# Patient Record
Sex: Female | Born: 1965 | Race: White | Hispanic: No | Marital: Married | Smoking: Never smoker
Health system: Southern US, Community
[De-identification: ages and names within clinical notes are randomized; demographics above are authoritative.]

## PROBLEM LIST (undated history)

## (undated) DIAGNOSIS — K759 Inflammatory liver disease, unspecified: Secondary | ICD-10-CM

## (undated) DIAGNOSIS — R200 Anesthesia of skin: Secondary | ICD-10-CM

## (undated) DIAGNOSIS — F329 Major depressive disorder, single episode, unspecified: Secondary | ICD-10-CM

## (undated) DIAGNOSIS — R202 Paresthesia of skin: Secondary | ICD-10-CM

## (undated) DIAGNOSIS — E785 Hyperlipidemia, unspecified: Secondary | ICD-10-CM

## (undated) DIAGNOSIS — M1990 Unspecified osteoarthritis, unspecified site: Secondary | ICD-10-CM

## (undated) DIAGNOSIS — F32A Depression, unspecified: Secondary | ICD-10-CM

## (undated) DIAGNOSIS — K219 Gastro-esophageal reflux disease without esophagitis: Secondary | ICD-10-CM

## (undated) DIAGNOSIS — R350 Frequency of micturition: Secondary | ICD-10-CM

## (undated) HISTORY — DX: Gastro-esophageal reflux disease without esophagitis: K21.9

## (undated) HISTORY — DX: Hyperlipidemia, unspecified: E78.5

## (undated) HISTORY — PX: TUBAL LIGATION: SHX77

## (undated) HISTORY — PX: SHOULDER SURGERY: SHX246

---

## 1983-05-06 DIAGNOSIS — M1990 Unspecified osteoarthritis, unspecified site: Secondary | ICD-10-CM | POA: Insufficient documentation

## 2000-11-27 ENCOUNTER — Other Ambulatory Visit: Admission: RE | Admit: 2000-11-27 | Discharge: 2000-11-27 | Payer: Self-pay | Admitting: Family Medicine

## 2002-03-15 ENCOUNTER — Other Ambulatory Visit: Admission: RE | Admit: 2002-03-15 | Discharge: 2002-03-15 | Payer: Self-pay | Admitting: Family Medicine

## 2006-04-13 ENCOUNTER — Other Ambulatory Visit: Admission: RE | Admit: 2006-04-13 | Discharge: 2006-04-13 | Payer: Self-pay | Admitting: Family Medicine

## 2006-05-01 ENCOUNTER — Encounter: Admission: RE | Admit: 2006-05-01 | Discharge: 2006-05-01 | Payer: Self-pay | Admitting: Family Medicine

## 2006-10-05 ENCOUNTER — Encounter: Admission: RE | Admit: 2006-10-05 | Discharge: 2006-10-05 | Payer: Self-pay | Admitting: Family Medicine

## 2007-04-15 ENCOUNTER — Ambulatory Visit: Payer: Self-pay | Admitting: Cardiovascular Disease

## 2007-05-12 ENCOUNTER — Ambulatory Visit: Payer: Self-pay

## 2008-04-14 ENCOUNTER — Ambulatory Visit: Payer: Self-pay | Admitting: Internal Medicine

## 2008-05-04 ENCOUNTER — Encounter: Payer: Self-pay | Admitting: Internal Medicine

## 2008-05-04 ENCOUNTER — Ambulatory Visit: Payer: Self-pay | Admitting: Internal Medicine

## 2008-05-04 ENCOUNTER — Ambulatory Visit (HOSPITAL_COMMUNITY): Admission: RE | Admit: 2008-05-04 | Discharge: 2008-05-04 | Payer: Self-pay | Admitting: Internal Medicine

## 2008-07-11 ENCOUNTER — Telehealth (INDEPENDENT_AMBULATORY_CARE_PROVIDER_SITE_OTHER): Payer: Self-pay | Admitting: *Deleted

## 2010-09-17 NOTE — H&P (Signed)
NAME:  Gina, Lawrence             ACCOUNT NO.:  [id number]   MEDICAL RECORD NO.:  [id number]          PATIENT TYPE:  AMB   LOCATION:  DAY                           FACILITY:  [facility]   PHYSICIAN:  R. Roetta Sessions, M.D. DATE OF BIRTH:  1965/06/29   DATE OF ADMISSION:  DATE OF DISCHARGE:  LH                              HISTORY & PHYSICAL   REFERRING PHYSICIAN:  [facility].   HISTORY OF PRESENT ILLNESS:  [name] is a very pleasant 45-  year-old Caucasian female from [place], [place] who was referred  by [facility] to further evaluate a 3-week  history of intermittent rectal bleeding.  She is intermittently  constipated, became constipated about 3 weeks ago, and had to strain  quite a bit. She started having some anorectal pain and passing quite a  bit of blood intermittently. She saw the folks over there at [facility]. Stool was found be strongly Hemoccult  positive.  She has been given some antifungal hydrocortisone cream and  suppositories with significant improvement in the pain, but she  continues to pass intermittent blood per rectum.  She has not had any  associated abdominal pain.   PAST MEDICAL HISTORY:  Most significant in that in 2006 she saw Dr.  Elmyra Ricks for bleeding.  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 That apparently was the sole significant finding. She  was brought back for flexible sigmoidoscopy in April of 2007.  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  She had a follow-up exam in January  2008 and was found to have only internal hemorrhoids.  There is no  family history of colonic polyps or GI neoplasia. She has no history  melena, rarely has any upper GI tract symptoms such as odynophagia,  dysphagia, or early sign of reflux symptoms, nausea or vomiting.  She  denies change  in weight.   PAST MEDICAL HISTORY:  Significant for occasional history of PACs.   PAST SURGERIES:  Tubal ligation, right shoulder surgery, cauterization  of the uterine wall, multiple colonoscopies per Dr. Cleotis Nipper.   CURRENT MEDICATIONS:  No prescribed medications.   ALLERGIES:  Question PENICILLIN and a NASAL SPRAY.   FAMILY HISTORY:  Father died with leukemia.  Mother has asthma and  hypertension.  No history chronic GI or liver illness.   SOCIAL HISTORY:  The patient is married. 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 No tobacco. Occasionally consumes an alcoholic beverage maybe  once a month.   REVIEW OF SYSTEMS:  No chest pain and no dyspnea on exertion.  No fever,  chills.  No night sweats. No change in weight.   PHYSICAL EXAMINATION:  GENERAL APPEARANCE:  Pleasant 45 year old lady  resting comfortably.  VITAL SIGNS:  Weight 174, height 5 feet 6, temperature 98.1, BP 120/84,  pulse 72.  SKIN:  Warm and dry.  There is no jaundice.  There is no stigmata of  chronic liver disease.  HEENT: No scleral  icterus.  JVD is not prominent.  CHEST:  Lungs are clear to auscultation.  COR:  Regular rate and rhythm without murmur, gallop, or rub  ABDOMEN:  Nondistended.  Positive bowel sounds.  She has a tattoo on her  right upper quadrant. Her abdomen is entirely soft, nontender without  appreciable mass or organomegaly.  EXTREMITIES: No edema.  RECTAL:  No external lesions.  Digital exam is easily performed. There  is good sphincter tone. The patient really did not have any unusual  tenderness.  No mass in the rectal vault.  No stool in rectal vault.  Mucous is Hemoccult negative.   IMPRESSION:  [name] is very pleasant 45 year old lady  with intermittent rectal bleeding in a setting of constipation. Recently  she had significant anorectal pain 3 weeks ago which has subsided with  topical anti-inflammatory/antifungal agents. History of her erectile  villous  adenoma removed previously.   With her pain and bleeding, it almost sounds like an anal fissure may  have opened up, and now has closed. She is very tolerant of the digital  rectal examination today. However, given her rectal bleeding and  history, personal history of villous adenoma, I told Ms. Dorame that  there is really no way around diagnostic colonoscopy at this time. This  really needs to be done. The risks, benefits, alternatives, and  limitations have been reviewed and questions answered. All parties are  agreeable.  Will plan for diagnostic colonoscopy in the very near future  at [facility] and then will make further recommendations.   I would like to thank [facility] for letting me  see this very nice lady today in  consultation.      Jonathon Bellows, M.D.  Electronically Signed     RMR/MEDQ  D:  04/14/2008  T:  04/14/2008  Job:  [id number]   cc:   [facility]  [place], [place]

## 2010-09-17 NOTE — Assessment & Plan Note (Signed)
[facility]                            CARDIOLOGY OFFICE NOTE   Gina Lawrence, Gina Lawrence                    MRN:          [id number]  DATE:04/15/2007                            DOB:          1965/12/20    Ms. Sweeny is a 45 year old patient referred by Dr. Christell Constant for chest  pain, shortness of breath, and premature ventricular contractions.   The patient has been having symptoms for the last 3 to 4 months.  They  have been increasing in frequency and severity.  They can occur almost  daily.  They can last hours.  She has bad days and good days.  She does  not report chest pain, but heaviness.  She has difficulty taking a deep  breath.  She feels pressure in her chest.  There is no radiation.  There  is no associated diaphoresis.  The patient has not had a cardiac workup.   Her coronary risk factors are minimal.  She is a nonsmoker, non  diabetic, non hypertensive.  Cholesterol status is unknown, and family  history is negative.   The patient is concerned about her heart.  She has a sister who had  these symptoms and had PVCs, and as a result she had a cardiac monitor  that was done, it was benign.  There was an occasional PAC or PVC and  sinus arrhythmia, but no significant sustained arrhythmias.   I read through approximately 15 pages of records from Dr. Kathi Der  office, and also went over the patient's CardioNet monitor.  Time for  this was 10 minutes.   In talking to the patient, she really does not notice her premature  ventricular contractions, she denies rapid palpitations, syncope.   She has had exertional dyspnea.  This occurs at work as well as at home.  She can be short of breath just sitting at her computer.   There is no pleuritic component, no history of DVT or PE.   Her review of systems, otherwise, negative.   PAST MEDICAL HISTORY:  Remarkable for right rotator cuff surgery, tubal  ligation.   The patient is happily married,  she has 2 teenage sons.  One of them has  schizophrenia.  I suspect this is part of the etiology to her symptoms  and stress.  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  She does not smoke or drink.   She is originally from the [place] area.   FAMILY HISTORY:  Remarkable for mother having cardiomyopathy and dying  at the age of 13; however, cardiomyopathy was not premature onset.  Father had leukemia in his 7s or 29s.   She has a sister aged 9 who has PVCs and carries nitro.   CURRENT MEDICATIONS:  Protonix 40 a day which she just started.   She denies any allergies.   PHYSICAL EXAMINATION:  Remarkable for a pleasant, middle-aged white  female in no distress.  Affect is appropriate.  Blood pressure is 120/70, pulse is 80 and regular.  Weight is 164,  respiratory rate 14.  HEENT:  Unremarkable.  Carotids normal without bruit, no lymphadenopathy or thyromegaly, or  JVP  elevation.  LUNGS:  Clear, good diaphragmatic motion, no wheezing.  S1 and S2 with normal heart sounds.  PMI normal.  ABDOMEN:  Benign.  Bowel sounds positive.  No tenderness, no AAA, no  hepatosplenomegaly or hepatojugular reflux.  Distal pulses are intact with no edema.  NEURO:  Nonfocal.  SKIN:  Warm and dry.  No muscular weakness.   Baseline EKG normal.   IMPRESSION:  1. Atypical chest pain, stress Myoview.  Rule out coronary disease.  2. Premature ventricular contractions, rule out structural heart      disease.  Check 2D echocardiogram, assess left ventricular      function, and rule out valvular disease.  No indication for beta      blocker therapy.  3. Probable anxiety and depression.  Follow up with Dr. Christell Constant,      consider starting selective serotonin reuptake inhibitor.  4. History of reflux.  Continue Protonix 40 mg a day, 6-week trial.      Avoid spicy foods.  5. Previous right rotator cuff surgery, seems to have recovered well.      Again, do range of motion exercises at home.   The patient will be seen on  a p.r.n. basis only if her stress test and  echo are abnormal.     Theron Arista C. Eden Emms, MD, [facility]  Electronically Signed    PCN/MedQ  DD: 04/15/2007  DT: 04/15/2007  Job #: [id number]

## 2010-09-17 NOTE — Op Note (Signed)
NAME:  Gina Lawrence, Gina Lawrence             ACCOUNT NO.:  [id number]   MEDICAL RECORD NO.:  [id number]          PATIENT TYPE:  AMB   LOCATION:  DAY                           FACILITY:  [facility]   PHYSICIAN:  R. Roetta Sessions, M.D. DATE OF BIRTH:  12-10-1965   DATE OF PROCEDURE:  DATE OF DISCHARGE:                               OPERATIVE REPORT   PROCEDURE:  Colonoscopy with biopsy and snare polypectomy.   INDICATIONS FOR PROCEDURE:  A 45 year old lady with intermittent rectal  bleeding in the setting of constipation.  She has had some anorectal  pain, which has subsided recently.  She has a history of a villous  adenoma in her rectum removed by Dr. Cleotis Nipper over in [place] previously.  Reportedly, the path illness was a tubulovillous adenoma with no  carcinoma.  Colonoscopy is now being done.  Risks, benefits, and  limitations have been reviewed, questions answered.  She is agreeable.  Please see the documentation in the medical record.   PROCEDURE NOTE:  O2 saturation, blood pressure, pulse, and respiration  monitored throughout the entire procedure.   CONSCIOUS SEDATION:  Versed 4 mg IV and Demerol 100 mg IV in divided  doses.   INSTRUMENT:  Pentax video chip system.   FINDINGS:  Digital rectal exam revealed some couple pea-sized lesions  and palpable anal verge.  Endoscopic findings of the rectum:  Examination of rectal mucosa including retroflexed view of the anal  verge demonstrated couple of anal papilla and a 1-2 cm polypoid mass  very low in the rectum approximately 1 cm from the anal verge that had  about 1 cm.  This lesion was more protuberant and this lesion splayed  out and was more of a carpet-type polypoid lesion and making up the  other 1 cm.  Again, this lesion was very low.  It was well seen on fossa  and retroflexed.  The remainder of rectal mucosa appeared normal.  Colon:  Colonic mucosa was surveyed from the rectosigmoid junction  through the left transverse, right colon  appendiceal orifice, ileocecal  valve, and cecum.  These structures well seen and photographed for the  record.  From this level, the scope was slowly and cautiously withdrawn.  All previously mentioned mucosal surfaces were again seen.  There was  diminutive polyps in the were cecum, which was cold biopsied/removed.  The remainder of colonic mucosa appeared normal.  Attention was turned  to the lesion in the distal rectum.  This lesion was engaged with a  snare on fos and with 2 passes, it was felt to have been removed.  There  was good hemostasis.  The patient tolerated the procedure well and was  reactive to Endoscopy.   IMPRESSION:  1. Anal papilla, very low lying; 1-2 cm adenomatous-appearing lesion      as described above were removed with 2 passes of hot snare cautery,      otherwise rectal mucosa appeared unremarkable.  2. Diminutive cecal polyp, status post cold biopsy, removal of colonic      mucosa appeared normal.   RECOMMENDATIONS:  1. Colace 100 mg orally twice daily  for the next 10 days.  2. I would like her to avoid straining/constipation.  She certainly      will take some MiraLax 17 g orally bedtime p.r.n. for constipation.      We will follow up on path.  3. No Advil or aspirin for the next 5 days.  4. Follow up on path.  5. Further recommendations to follow.      Jonathon Bellows, M.D.  Electronically Signed     RMR/MEDQ  D:  05/04/2008  T:  05/04/2008  Job:  [id number]

## 2011-02-21 ENCOUNTER — Encounter: Payer: Self-pay | Admitting: Internal Medicine

## 2012-07-27 ENCOUNTER — Ambulatory Visit (INDEPENDENT_AMBULATORY_CARE_PROVIDER_SITE_OTHER): Payer: Managed Care, Other (non HMO) | Admitting: Family Medicine

## 2012-07-27 ENCOUNTER — Encounter: Payer: Self-pay | Admitting: Family Medicine

## 2012-07-27 ENCOUNTER — Telehealth: Payer: Self-pay | Admitting: Nurse Practitioner

## 2012-07-27 ENCOUNTER — Ambulatory Visit (INDEPENDENT_AMBULATORY_CARE_PROVIDER_SITE_OTHER): Payer: Managed Care, Other (non HMO)

## 2012-07-27 VITALS — BP 119/80 | HR 69 | Temp 98.7°F | Ht 64.75 in | Wt 180.0 lb

## 2012-07-27 DIAGNOSIS — M542 Cervicalgia: Secondary | ICD-10-CM

## 2012-07-27 MED ORDER — MELOXICAM 15 MG PO TABS: ORAL_TABLET | ORAL | Status: DC

## 2012-07-27 MED ORDER — CYCLOBENZAPRINE HCL 10 MG PO TABS: ORAL_TABLET | ORAL | Status: DC

## 2012-07-27 NOTE — Telephone Encounter (Signed)
Patient would like an appt today for unexplained neck pain at base of neck at the back of her head along with a headache.

## 2012-07-27 NOTE — Patient Instructions (Addendum)
Moist heat  No lifting Zantac 150 twice daily before meals Be sure and take Mobic after meals

## 2012-07-27 NOTE — Progress Notes (Signed)
  Subjective:    Patient ID: Gina Lawrence, female    DOB: [birth year], 47 y.o.   MRN: [id number]  HPI Neck pain for 8 days.No specific injury. No past injury. History of cervical arthritis. Taking over-the-counter NSAIDs with no relief. Pain worse with looking right or left. The pain seems to radiate down between the shoulder blades. No fever noted. Headaches off and on frontal in nature but not today   Review of Systems  Musculoskeletal: Positive for back pain (neck, started 1 wk ago).  Neurological: Positive for dizziness (occasional) and headaches.       Objective:   Physical Exam  Nursing note and vitals reviewed. Constitutional: She is oriented to person, place, and time. She appears well-developed and well-nourished.  HENT:  Head: Normocephalic.  Eyes: Conjunctivae and EOM are normal.  Neck: Neck supple. No tracheal deviation present. No thyromegaly present.  Limited  rom due to increased pain  Musculoskeletal: Normal range of motion.  Except neck  Lymphadenopathy:    She has no cervical adenopathy.  Neurological: She is alert and oriented to person, place, and time. She has normal reflexes. She displays normal reflexes. Coordination normal.  Psychiatric: She has a normal mood and affect. Her behavior is normal. Judgment and thought content normal.   [facility] reading (PRIMARY) by  Dr. Rudi Heap: There appears to be a lot of abnormal straightening of the cervical spine indicating muscle spasm. Some spurring present. There appears to be some narrowing of the foramen.                                       Assessment & Plan:   1. Posterior neck pain  - DG Cervical Spine Complete; Future - meloxicam (MOBIC) 15 MG tablet; 1/2 tab twice daily pc  Dispense: 30 tablet; Refill: 0 - cyclobenzaprine (FLEXERIL) 10 MG tablet; Twice daily after meals  Dispense: 30 tablet; Refill: 0  2. Protect stomach from NSAID 3 follow directions and patient instruction

## 2012-07-27 NOTE — Telephone Encounter (Signed)
APPT MADE FOR TODAY

## 2013-10-19 ENCOUNTER — Ambulatory Visit: Payer: Managed Care, Other (non HMO) | Admitting: Physician Assistant

## 2014-01-03 ENCOUNTER — Ambulatory Visit: Payer: Managed Care, Other (non HMO) | Attending: Neurosurgery | Admitting: Physical Therapy

## 2014-01-03 DIAGNOSIS — IMO0001 Reserved for inherently not codable concepts without codable children: Secondary | ICD-10-CM | POA: Diagnosis not present

## 2014-01-03 DIAGNOSIS — M47812 Spondylosis without myelopathy or radiculopathy, cervical region: Secondary | ICD-10-CM | POA: Diagnosis not present

## 2014-01-03 DIAGNOSIS — R5381 Other malaise: Secondary | ICD-10-CM | POA: Diagnosis not present

## 2014-01-05 ENCOUNTER — Ambulatory Visit: Payer: Managed Care, Other (non HMO) | Admitting: Physical Therapy

## 2014-01-05 DIAGNOSIS — IMO0001 Reserved for inherently not codable concepts without codable children: Secondary | ICD-10-CM | POA: Diagnosis not present

## 2014-01-10 ENCOUNTER — Ambulatory Visit: Payer: Managed Care, Other (non HMO) | Admitting: *Deleted

## 2014-01-10 DIAGNOSIS — IMO0001 Reserved for inherently not codable concepts without codable children: Secondary | ICD-10-CM | POA: Diagnosis not present

## 2014-01-13 ENCOUNTER — Ambulatory Visit: Payer: Managed Care, Other (non HMO) | Admitting: *Deleted

## 2014-01-13 DIAGNOSIS — IMO0001 Reserved for inherently not codable concepts without codable children: Secondary | ICD-10-CM | POA: Diagnosis not present

## 2014-01-16 ENCOUNTER — Other Ambulatory Visit (HOSPITAL_COMMUNITY): Payer: Self-pay | Admitting: Nurse Practitioner

## 2014-01-16 DIAGNOSIS — Z139 Encounter for screening, unspecified: Secondary | ICD-10-CM

## 2014-01-17 ENCOUNTER — Ambulatory Visit: Payer: Managed Care, Other (non HMO) | Admitting: *Deleted

## 2014-01-17 DIAGNOSIS — IMO0001 Reserved for inherently not codable concepts without codable children: Secondary | ICD-10-CM | POA: Diagnosis not present

## 2014-01-19 ENCOUNTER — Ambulatory Visit (HOSPITAL_COMMUNITY): Payer: Managed Care, Other (non HMO)

## 2014-01-24 ENCOUNTER — Ambulatory Visit: Payer: Managed Care, Other (non HMO) | Admitting: *Deleted

## 2014-01-24 DIAGNOSIS — IMO0001 Reserved for inherently not codable concepts without codable children: Secondary | ICD-10-CM | POA: Diagnosis not present

## 2014-01-25 ENCOUNTER — Ambulatory Visit (HOSPITAL_COMMUNITY)
Admission: RE | Admit: 2014-01-25 | Discharge: 2014-01-25 | Disposition: A | Discharge status: Home / Self Care | Payer: Managed Care, Other (non HMO) | Source: Ambulatory Visit | Attending: Nurse Practitioner | Admitting: Nurse Practitioner

## 2014-01-25 DIAGNOSIS — Z1231 Encounter for screening mammogram for malignant neoplasm of breast: Secondary | ICD-10-CM | POA: Diagnosis present

## 2014-01-25 DIAGNOSIS — Z139 Encounter for screening, unspecified: Secondary | ICD-10-CM

## 2014-01-26 ENCOUNTER — Encounter: Payer: Managed Care, Other (non HMO) | Admitting: *Deleted

## 2014-02-01 ENCOUNTER — Other Ambulatory Visit (HOSPITAL_COMMUNITY): Payer: Self-pay | Admitting: Neurosurgery

## 2014-02-06 ENCOUNTER — Encounter (HOSPITAL_COMMUNITY): Payer: Self-pay | Admitting: Pharmacy Technician

## 2014-02-08 NOTE — Pre-Procedure Instructions (Signed)
Gina Lawrence  02/08/2014   Your procedure is scheduled on:  Tuesday February 14, 2014 at 11:30 AM.  Report to [facility] Admitting at 9:30 AM.  Call this number if you have problems the morning of surgery: [phone]   Remember:   Do not eat food or drink liquids after midnight.   Take these medicines the morning of surgery with A SIP OF WATER: Buspirone (Buspar), Omeprazole (Prilosec), Prempro   Do not wear jewelry, make-up or nail polish.  Do not wear lotions, powders, or perfumes.   Do not shave 48 hours prior to surgery.   Do not bring valuables to the hospital.  [facility] is not responsible for any belongings or valuables.               Contacts, dentures or bridgework may not be worn into surgery.  Leave suitcase in the car. After surgery it may be brought to your room.  For patients admitted to the hospital, discharge time is determined by your treatment team.               Patients discharged the day of surgery will not be allowed to drive home.  Name and phone number of your driver: Family/Friend  Special Instructions: Shower using CHG soap the night before and the morning of your surgery   Please read over the following fact sheets that you were given: Pain Booklet, Coughing and Deep Breathing, MRSA Information and Surgical Site Infection Prevention

## 2014-02-09 ENCOUNTER — Encounter (HOSPITAL_COMMUNITY)
Admission: RE | Admit: 2014-02-09 | Discharge: 2014-02-09 | Disposition: A | Discharge status: Home / Self Care | Payer: Managed Care, Other (non HMO) | Source: Ambulatory Visit | Attending: Neurosurgery | Admitting: Neurosurgery

## 2014-02-09 ENCOUNTER — Encounter (HOSPITAL_COMMUNITY): Payer: Self-pay

## 2014-02-09 DIAGNOSIS — K219 Gastro-esophageal reflux disease without esophagitis: Secondary | ICD-10-CM | POA: Diagnosis not present

## 2014-02-09 DIAGNOSIS — Z0000 Encounter for general adult medical examination without abnormal findings: Secondary | ICD-10-CM | POA: Insufficient documentation

## 2014-02-09 DIAGNOSIS — M4722 Other spondylosis with radiculopathy, cervical region: Secondary | ICD-10-CM | POA: Insufficient documentation

## 2014-02-09 DIAGNOSIS — R350 Frequency of micturition: Secondary | ICD-10-CM | POA: Insufficient documentation

## 2014-02-09 DIAGNOSIS — M1990 Unspecified osteoarthritis, unspecified site: Secondary | ICD-10-CM | POA: Insufficient documentation

## 2014-02-09 DIAGNOSIS — F329 Major depressive disorder, single episode, unspecified: Secondary | ICD-10-CM | POA: Insufficient documentation

## 2014-02-09 DIAGNOSIS — Z8619 Personal history of other infectious and parasitic diseases: Secondary | ICD-10-CM | POA: Insufficient documentation

## 2014-02-09 DIAGNOSIS — R200 Anesthesia of skin: Secondary | ICD-10-CM | POA: Insufficient documentation

## 2014-02-09 HISTORY — DX: Frequency of micturition: R35.0

## 2014-02-09 HISTORY — DX: Anesthesia of skin: R20.0

## 2014-02-09 HISTORY — DX: Paresthesia of skin: R20.2

## 2014-02-09 HISTORY — DX: Unspecified osteoarthritis, unspecified site: M19.90

## 2014-02-09 HISTORY — DX: Inflammatory liver disease, unspecified: K75.9

## 2014-02-09 HISTORY — DX: Major depressive disorder, single episode, unspecified: F32.9

## 2014-02-09 HISTORY — DX: Depression, unspecified: F32.A

## 2014-02-09 LAB — COMPREHENSIVE METABOLIC PANEL
ALBUMIN: 3.5 g/dL (ref 3.5–5.2)
ALK PHOS: 92 U/L (ref 39–117)
ALT: 10 U/L (ref 0–35)
ANION GAP: 12 (ref 5–15)
AST: 15 U/L (ref 0–37)
BUN: 9 mg/dL (ref 6–23)
CHLORIDE: 107 meq/L (ref 96–112)
CO2: 25 meq/L (ref 19–32)
Calcium: 8.9 mg/dL (ref 8.4–10.5)
Creatinine, Ser: 0.75 mg/dL (ref 0.50–1.10)
GFR calc Af Amer: >90 mL/min (ref >90)
GFR calc non Af Amer: >90 mL/min (ref >90)
Glucose, Bld: 102 mg/dL — ABNORMAL HIGH (ref 70–99)
Potassium: 3.8 mEq/L (ref 3.7–5.3)
Sodium: 144 mEq/L (ref 137–147)
Total Protein: 6.9 g/dL (ref 6.0–8.3)

## 2014-02-09 LAB — CBC
HCT: 40 % (ref 36.0–46.0)
Hemoglobin: 13.8 g/dL (ref 12.0–15.0)
MCH: 29.8 pg (ref 26.0–34.0)
MCHC: 34.5 g/dL (ref 30.0–36.0)
MCV: 86.4 fL (ref 78.0–100.0)
PLATELETS: 259 10*3/uL (ref 150–400)
RBC: 4.63 MIL/uL (ref 3.87–5.11)
RDW: 12.8 % (ref 11.5–15.5)
WBC: 6.2 10*3/uL (ref 4.0–10.5)

## 2014-02-09 LAB — SURGICAL PCR SCREEN
MRSA, PCR: NEGATIVE
Staphylococcus aureus: NEGATIVE

## 2014-02-09 LAB — HCG, SERUM, QUALITATIVE: PREG SERUM: NEGATIVE

## 2014-02-09 NOTE — Progress Notes (Signed)
PCP is at [facility]. Patient informed Nurse that she had a stress test approximately 5 years ago but stated it was related to menopause symptoms. Patient stated "I could feel something in my chest. It was like it skipped a beat. I went to the doctor and they said I was having some PAC's or PVC's something like that, but I looked it up online and it was related to menopause." Patient denied having any acute cardiac or pulmonary issues. Patient informed Nurse that as a child she had hepatitis and was told not to ever donate blood.

## 2014-02-14 ENCOUNTER — Observation Stay (HOSPITAL_COMMUNITY)
Admission: RE | Admit: 2014-02-14 | Discharge: 2014-02-15 | Disposition: A | Discharge status: Home / Self Care | Payer: Managed Care, Other (non HMO) | Source: Ambulatory Visit | Attending: Neurosurgery | Admitting: Neurosurgery

## 2014-02-14 ENCOUNTER — Encounter (HOSPITAL_COMMUNITY): Payer: Managed Care, Other (non HMO) | Admitting: Anesthesiology

## 2014-02-14 ENCOUNTER — Encounter (HOSPITAL_COMMUNITY)
Admission: RE | Disposition: A | Discharge status: Home / Self Care | Payer: Self-pay | Source: Ambulatory Visit | Attending: Neurosurgery

## 2014-02-14 ENCOUNTER — Encounter (HOSPITAL_COMMUNITY): Payer: Self-pay | Admitting: Surgery

## 2014-02-14 ENCOUNTER — Inpatient Hospital Stay (HOSPITAL_COMMUNITY): Payer: Managed Care, Other (non HMO) | Admitting: Anesthesiology

## 2014-02-14 ENCOUNTER — Inpatient Hospital Stay (HOSPITAL_COMMUNITY): Payer: Managed Care, Other (non HMO)

## 2014-02-14 DIAGNOSIS — M479 Spondylosis, unspecified: Secondary | ICD-10-CM

## 2014-02-14 DIAGNOSIS — F329 Major depressive disorder, single episode, unspecified: Secondary | ICD-10-CM | POA: Insufficient documentation

## 2014-02-14 DIAGNOSIS — M4722 Other spondylosis with radiculopathy, cervical region: Principal | ICD-10-CM | POA: Insufficient documentation

## 2014-02-14 DIAGNOSIS — K219 Gastro-esophageal reflux disease without esophagitis: Secondary | ICD-10-CM | POA: Insufficient documentation

## 2014-02-14 HISTORY — PX: ANTERIOR CERVICAL DECOMP/DISCECTOMY FUSION: SHX1161

## 2014-02-14 LAB — CREATININE, SERUM
Creatinine, Ser: 0.66 mg/dL (ref 0.50–1.10)
GFR calc Af Amer: >90 mL/min (ref >90)

## 2014-02-14 LAB — CBC
HCT: 41.7 % (ref 36.0–46.0)
HEMOGLOBIN: 13.8 g/dL (ref 12.0–15.0)
MCH: 28.9 pg (ref 26.0–34.0)
MCHC: 33.1 g/dL (ref 30.0–36.0)
MCV: 87.4 fL (ref 78.0–100.0)
Platelets: 308 10*3/uL (ref 150–400)
RBC: 4.77 MIL/uL (ref 3.87–5.11)
RDW: 12.7 % (ref 11.5–15.5)
WBC: 10.9 10*3/uL — ABNORMAL HIGH (ref 4.0–10.5)

## 2014-02-14 SURGERY — ANTERIOR CERVICAL DECOMPRESSION/DISCECTOMY FUSION 3 LEVELS
Anesthesia: General | Site: Neck

## 2014-02-14 MED ORDER — ACETAMINOPHEN 325 MG PO TABS: 650.0000 mg | ORAL_TABLET | ORAL | Status: DC | PRN

## 2014-02-14 MED ORDER — VECURONIUM BROMIDE 10 MG IV SOLR
INTRAVENOUS | Status: AC
  Filled 2014-02-14: qty 10

## 2014-02-14 MED ORDER — DEXMEDETOMIDINE HCL IN NACL 200 MCG/50ML IV SOLN
INTRAVENOUS | Status: AC
  Filled 2014-02-14: qty 50

## 2014-02-14 MED ORDER — VITAMIN D3 25 MCG (1000 UNIT) PO TABS
2000.0000 [IU] | ORAL_TABLET | Freq: Every day | ORAL | Status: DC
  Filled 2014-02-14 (×2): qty 2

## 2014-02-14 MED ORDER — SODIUM CHLORIDE 0.9 % IR SOLN
Status: DC | PRN
  Administered 2014-02-14: 13:00:00

## 2014-02-14 MED ORDER — LIDOCAINE-EPINEPHRINE 1 %-1:100000 IJ SOLN
INTRAMUSCULAR | Status: DC | PRN
  Administered 2014-02-14: 9 mL

## 2014-02-14 MED ORDER — LIDOCAINE HCL (CARDIAC) 20 MG/ML IV SOLN
INTRAVENOUS | Status: AC
  Filled 2014-02-14: qty 10

## 2014-02-14 MED ORDER — FENTANYL CITRATE 0.05 MG/ML IJ SOLN
INTRAMUSCULAR | Status: AC
  Filled 2014-02-14: qty 5

## 2014-02-14 MED ORDER — ONDANSETRON HCL 4 MG/2ML IJ SOLN
INTRAMUSCULAR | Status: DC | PRN
  Administered 2014-02-14: 4 mg via INTRAVENOUS

## 2014-02-14 MED ORDER — ROCURONIUM BROMIDE 100 MG/10ML IV SOLN
INTRAVENOUS | Status: DC | PRN
  Administered 2014-02-14: 50 mg via INTRAVENOUS

## 2014-02-14 MED ORDER — PHENOL 1.4 % MT LIQD: 1.0000 | OROMUCOSAL | Status: DC | PRN

## 2014-02-14 MED ORDER — PANTOPRAZOLE SODIUM 40 MG PO TBEC
40.0000 mg | DELAYED_RELEASE_TABLET | Freq: Every day | ORAL | Status: DC
  Administered 2014-02-14: 40 mg via ORAL
  Filled 2014-02-14: qty 1

## 2014-02-14 MED ORDER — SODIUM CHLORIDE 0.9 % IV SOLN
INTRAVENOUS | Status: DC
  Administered 2014-02-14 (×2): via INTRAVENOUS

## 2014-02-14 MED ORDER — LACTATED RINGERS IV SOLN
INTRAVENOUS | Status: DC
  Administered 2014-02-14 (×2): via INTRAVENOUS

## 2014-02-14 MED ORDER — CONJ ESTROG-MEDROXYPROGEST ACE 0.625-2.5 MG PO TABS
1.0000 | ORAL_TABLET | Freq: Every day | ORAL | Status: DC
  Filled 2014-02-14 (×2): qty 1

## 2014-02-14 MED ORDER — MENTHOL 3 MG MT LOZG
1.0000 | LOZENGE | OROMUCOSAL | Status: DC | PRN
Start: 2014-02-14 — End: 2014-02-15
  Administered 2014-02-14: 3 mg via ORAL
  Filled 2014-02-14: qty 9

## 2014-02-14 MED ORDER — HYDROMORPHONE HCL 1 MG/ML IJ SOLN
0.5000 mg | INTRAMUSCULAR | Status: DC | PRN
  Administered 2014-02-14 (×2): 1 mg via INTRAVENOUS
  Filled 2014-02-14 (×2): qty 1

## 2014-02-14 MED ORDER — HEPARIN SODIUM (PORCINE) 5000 UNIT/ML IJ SOLN
5000.0000 [IU] | Freq: Three times a day (TID) | INTRAMUSCULAR | Status: DC
  Administered 2014-02-15: 5000 [IU] via SUBCUTANEOUS
  Filled 2014-02-14 (×4): qty 1

## 2014-02-14 MED ORDER — ONDANSETRON HCL 4 MG/2ML IJ SOLN
4.0000 mg | INTRAMUSCULAR | Status: DC | PRN
Start: 2014-02-14 — End: 2014-02-15

## 2014-02-14 MED ORDER — VECURONIUM BROMIDE 10 MG IV SOLR
INTRAVENOUS | Status: DC | PRN
  Administered 2014-02-14: 1 mg via INTRAVENOUS
  Administered 2014-02-14 (×2): 2 mg via INTRAVENOUS

## 2014-02-14 MED ORDER — VITAMIN D 50 MCG (2000 UT) PO CAPS: 1.0000 | ORAL_CAPSULE | Freq: Every day | ORAL | Status: DC

## 2014-02-14 MED ORDER — GLYCOPYRROLATE 0.2 MG/ML IJ SOLN
INTRAMUSCULAR | Status: DC | PRN
  Administered 2014-02-14: 0.6 mg via INTRAVENOUS

## 2014-02-14 MED ORDER — THROMBIN 20000 UNITS EX SOLR
CUTANEOUS | Status: DC | PRN
  Administered 2014-02-14: 13:00:00 via TOPICAL

## 2014-02-14 MED ORDER — SENNA 8.6 MG PO TABS
1.0000 | ORAL_TABLET | Freq: Two times a day (BID) | ORAL | Status: DC
  Administered 2014-02-14: 8.6 mg via ORAL
  Filled 2014-02-14 (×3): qty 1

## 2014-02-14 MED ORDER — PROPOFOL 10 MG/ML IV BOLUS
INTRAVENOUS | Status: AC
  Filled 2014-02-14: qty 20

## 2014-02-14 MED ORDER — SODIUM CHLORIDE 0.9 % IJ SOLN
INTRAMUSCULAR | Status: AC
  Filled 2014-02-14: qty 10

## 2014-02-14 MED ORDER — HYDROMORPHONE HCL 1 MG/ML IJ SOLN
0.2500 mg | INTRAMUSCULAR | Status: DC | PRN
  Administered 2014-02-14: 0.5 mg via INTRAVENOUS

## 2014-02-14 MED ORDER — DEXAMETHASONE SODIUM PHOSPHATE 10 MG/ML IJ SOLN
INTRAMUSCULAR | Status: AC
  Filled 2014-02-14: qty 1

## 2014-02-14 MED ORDER — THROMBIN 5000 UNITS EX SOLR
OROMUCOSAL | Status: DC | PRN
  Administered 2014-02-14: 13:00:00 via TOPICAL

## 2014-02-14 MED ORDER — ACETAMINOPHEN 650 MG RE SUPP: 650.0000 mg | RECTAL | Status: DC | PRN

## 2014-02-14 MED ORDER — SODIUM CHLORIDE 0.9 % IJ SOLN
3.0000 mL | Freq: Two times a day (BID) | INTRAMUSCULAR | Status: DC
  Administered 2014-02-14: 3 mL via INTRAVENOUS

## 2014-02-14 MED ORDER — FENTANYL CITRATE 0.05 MG/ML IJ SOLN
INTRAMUSCULAR | Status: DC | PRN
  Administered 2014-02-14: 100 ug via INTRAVENOUS
  Administered 2014-02-14: 150 ug via INTRAVENOUS
  Administered 2014-02-14: 100 ug via INTRAVENOUS

## 2014-02-14 MED ORDER — BUSPIRONE HCL 15 MG PO TABS
7.5000 mg | ORAL_TABLET | Freq: Three times a day (TID) | ORAL | Status: DC | PRN
  Filled 2014-02-14: qty 1

## 2014-02-14 MED ORDER — DOCUSATE SODIUM 100 MG PO CAPS
100.0000 mg | ORAL_CAPSULE | Freq: Two times a day (BID) | ORAL | Status: DC
  Administered 2014-02-14: 100 mg via ORAL
  Filled 2014-02-14 (×3): qty 1

## 2014-02-14 MED ORDER — CALCIUM CARBONATE 1250 (500 CA) MG PO TABS
1.0000 | ORAL_TABLET | Freq: Every day | ORAL | Status: DC
  Filled 2014-02-14 (×2): qty 1

## 2014-02-14 MED ORDER — LIDOCAINE HCL (CARDIAC) 20 MG/ML IV SOLN
INTRAVENOUS | Status: DC | PRN
  Administered 2014-02-14: 100 mg via INTRAVENOUS

## 2014-02-14 MED ORDER — MIDAZOLAM HCL 2 MG/2ML IJ SOLN
INTRAMUSCULAR | Status: AC
  Filled 2014-02-14: qty 2

## 2014-02-14 MED ORDER — DEXAMETHASONE SODIUM PHOSPHATE 10 MG/ML IJ SOLN
INTRAMUSCULAR | Status: DC | PRN
  Administered 2014-02-14: 10 mg via INTRAVENOUS

## 2014-02-14 MED ORDER — HYDROMORPHONE HCL 1 MG/ML IJ SOLN
INTRAMUSCULAR | Status: AC
Start: 2014-02-14 — End: 2014-02-15
  Filled 2014-02-14: qty 1

## 2014-02-14 MED ORDER — MIDAZOLAM HCL 5 MG/5ML IJ SOLN
INTRAMUSCULAR | Status: DC | PRN
  Administered 2014-02-14: 2 mg via INTRAVENOUS

## 2014-02-14 MED ORDER — 0.9 % SODIUM CHLORIDE (POUR BTL) OPTIME
TOPICAL | Status: DC | PRN
  Administered 2014-02-14: 1000 mL

## 2014-02-14 MED ORDER — DEXMEDETOMIDINE HCL 200 MCG/2ML IV SOLN
INTRAVENOUS | Status: DC | PRN
  Administered 2014-02-14: 8 ug via INTRAVENOUS
  Administered 2014-02-14: 12 ug via INTRAVENOUS
  Administered 2014-02-14: 8 ug via INTRAVENOUS

## 2014-02-14 MED ORDER — DIAZEPAM 5 MG PO TABS
5.0000 mg | ORAL_TABLET | Freq: Four times a day (QID) | ORAL | Status: DC | PRN
  Administered 2014-02-15: 5 mg via ORAL
  Filled 2014-02-14: qty 1

## 2014-02-14 MED ORDER — VANCOMYCIN HCL IN DEXTROSE 1-5 GM/200ML-% IV SOLN
1000.0000 mg | Freq: Once | INTRAVENOUS | Status: AC
  Administered 2014-02-14: 1000 mg via INTRAVENOUS
  Filled 2014-02-14: qty 200

## 2014-02-14 MED ORDER — VANCOMYCIN HCL IN DEXTROSE 1-5 GM/200ML-% IV SOLN
INTRAVENOUS | Status: AC
  Administered 2014-02-14: 1000 mg via INTRAVENOUS
  Filled 2014-02-14: qty 200

## 2014-02-14 MED ORDER — STERILE WATER FOR INJECTION IJ SOLN
INTRAMUSCULAR | Status: AC
  Filled 2014-02-14: qty 10

## 2014-02-14 MED ORDER — SODIUM CHLORIDE 0.9 % IJ SOLN
3.0000 mL | INTRAMUSCULAR | Status: DC | PRN
  Administered 2014-02-14: 3 mL via INTRAVENOUS

## 2014-02-14 MED ORDER — NEOSTIGMINE METHYLSULFATE 10 MG/10ML IV SOLN
INTRAVENOUS | Status: DC | PRN
  Administered 2014-02-14: 4 mg via INTRAVENOUS

## 2014-02-14 MED ORDER — OXYCODONE-ACETAMINOPHEN 5-325 MG PO TABS
1.0000 | ORAL_TABLET | ORAL | Status: DC | PRN
  Administered 2014-02-14 – 2014-02-15 (×2): 2 via ORAL
  Filled 2014-02-14 (×2): qty 2

## 2014-02-14 MED ORDER — PROPOFOL 10 MG/ML IV BOLUS
INTRAVENOUS | Status: DC | PRN
  Administered 2014-02-14: 150 mg via INTRAVENOUS

## 2014-02-14 SURGICAL SUPPLY — 72 items
ADH SKN CLS APL DERMABOND .7 (GAUZE/BANDAGES/DRESSINGS) ×1
ADH SKN CLS LQ APL DERMABOND (GAUZE/BANDAGES/DRESSINGS) ×2
APL SKNCLS STERI-STRIP NONHPOA (GAUZE/BANDAGES/DRESSINGS)
BAG DECANTER FOR FLEXI CONT (MISCELLANEOUS) ×3 IMPLANT
BENZOIN TINCTURE PRP APPL 2/3 (GAUZE/BANDAGES/DRESSINGS) IMPLANT
BLADE CLIPPER SURG (BLADE) IMPLANT
BLADE SURG 11 STRL SS (BLADE) ×3 IMPLANT
BUR DRUM 4.0 (BURR) IMPLANT
BUR DRUM 4.0MM (BURR)
BUR MATCHSTICK NEURO 3.0 LAGG (BURR) ×3 IMPLANT
CAGE PEEK VISTA-S CERV 11X14X5 (Cage) ×4 IMPLANT
CANISTER SUCT 3000ML (MISCELLANEOUS) ×3 IMPLANT
CLOSURE WOUND 1/2 X4 (GAUZE/BANDAGES/DRESSINGS)
CONT SPEC 4OZ CLIKSEAL STRL BL (MISCELLANEOUS) ×3 IMPLANT
DECANTER SPIKE VIAL GLASS SM (MISCELLANEOUS) ×3 IMPLANT
DERMABOND ADHESIVE PROPEN (GAUZE/BANDAGES/DRESSINGS) ×4
DERMABOND ADVANCED (GAUZE/BANDAGES/DRESSINGS) ×2
DERMABOND ADVANCED .7 DNX12 (GAUZE/BANDAGES/DRESSINGS) ×1 IMPLANT
DERMABOND ADVANCED .7 DNX6 (GAUZE/BANDAGES/DRESSINGS) IMPLANT
DRAIN CHANNEL 10M FLAT 3/4 FLT (DRAIN) IMPLANT
DRAPE C-ARM 42X72 X-RAY (DRAPES) ×6 IMPLANT
DRAPE LAPAROTOMY 100X72 PEDS (DRAPES) ×3 IMPLANT
DRAPE MICROSCOPE LEICA (MISCELLANEOUS) ×3 IMPLANT
DRAPE POUCH INSTRU U-SHP 10X18 (DRAPES) ×3 IMPLANT
DRAPE PROXIMA HALF (DRAPES) IMPLANT
DRSG OPSITE POSTOP 3X4 (GAUZE/BANDAGES/DRESSINGS) ×3 IMPLANT
DRSG OPSITE POSTOP 4X6 (GAUZE/BANDAGES/DRESSINGS) ×2 IMPLANT
DRSG TEGADERM 4X4.75 (GAUZE/BANDAGES/DRESSINGS) ×12 IMPLANT
DURAPREP 6ML APPLICATOR 50/CS (WOUND CARE) ×3 IMPLANT
ELECT COATED BLADE 2.86 ST (ELECTRODE) ×3 IMPLANT
ELECT REM PT RETURN 9FT ADLT (ELECTROSURGICAL) ×3
ELECTRODE REM PT RTRN 9FT ADLT (ELECTROSURGICAL) ×1 IMPLANT
EVACUATOR SILICONE 100CC (DRAIN) IMPLANT
GAUZE SPONGE 4X4 16PLY XRAY LF (GAUZE/BANDAGES/DRESSINGS) IMPLANT
GLOVE BIOGEL PI IND STRL 7.5 (GLOVE) ×1 IMPLANT
GLOVE BIOGEL PI INDICATOR 7.5 (GLOVE) ×2
GLOVE ECLIPSE 7.0 STRL STRAW (GLOVE) ×3 IMPLANT
GLOVE EXAM NITRILE LRG STRL (GLOVE) IMPLANT
GLOVE EXAM NITRILE MD LF STRL (GLOVE) IMPLANT
GLOVE EXAM NITRILE XL STR (GLOVE) IMPLANT
GLOVE EXAM NITRILE XS STR PU (GLOVE) IMPLANT
GOWN STRL REUS W/ TWL LRG LVL3 (GOWN DISPOSABLE) ×2 IMPLANT
GOWN STRL REUS W/ TWL XL LVL3 (GOWN DISPOSABLE) IMPLANT
GOWN STRL REUS W/TWL 2XL LVL3 (GOWN DISPOSABLE) IMPLANT
GOWN STRL REUS W/TWL LRG LVL3 (GOWN DISPOSABLE) ×6
GOWN STRL REUS W/TWL XL LVL3 (GOWN DISPOSABLE)
HEMOSTAT POWDER KIT SURGIFOAM (HEMOSTASIS) ×3 IMPLANT
KIT BASIN OR (CUSTOM PROCEDURE TRAY) ×3 IMPLANT
KIT ROOM TURNOVER OR (KITS) ×3 IMPLANT
NDL HYPO 25X1 1.5 SAFETY (NEEDLE) ×1 IMPLANT
NDL SPNL 22GX3.5 QUINCKE BK (NEEDLE) ×1 IMPLANT
NEEDLE HYPO 25X1 1.5 SAFETY (NEEDLE) ×3 IMPLANT
NEEDLE SPNL 22GX3.5 QUINCKE BK (NEEDLE) ×3 IMPLANT
NS IRRIG 1000ML POUR BTL (IV SOLUTION) ×3 IMPLANT
PACK LAMINECTOMY NEURO (CUSTOM PROCEDURE TRAY) ×3 IMPLANT
PAD ARMBOARD 7.5X6 YLW CONV (MISCELLANEOUS) ×9 IMPLANT
PEEK OPTIMA VISTA-S 11X14X5MM (Peek) ×2 IMPLANT
PLATE INVIZIA 3 LEV 54MM (Plate) ×2 IMPLANT
PUTTY BONE GRAFT KIT 2.5ML (Bone Implant) ×2 IMPLANT
RUBBERBAND STERILE (MISCELLANEOUS) ×6 IMPLANT
SCREW SELF DRILL VAR 12MM (Screw) ×16 IMPLANT
SPONGE INTESTINAL PEANUT (DISPOSABLE) ×3 IMPLANT
SPONGE SURGIFOAM ABS GEL 100 (HEMOSTASIS) ×3 IMPLANT
STRIP CLOSURE SKIN 1/2X4 (GAUZE/BANDAGES/DRESSINGS) IMPLANT
SUT ETHILON 3 0 FSL (SUTURE) IMPLANT
SUT VIC AB 3-0 SH 8-18 (SUTURE) ×3 IMPLANT
SUT VICRYL 3-0 RB1 18 ABS (SUTURE) ×3 IMPLANT
SYR 20ML ECCENTRIC (SYRINGE) ×3 IMPLANT
TAPE CLOTH 2X10 TAN LF (GAUZE/BANDAGES/DRESSINGS) ×3 IMPLANT
TOWEL OR 17X24 6PK STRL BLUE (TOWEL DISPOSABLE) ×3 IMPLANT
TOWEL OR 17X26 10 PK STRL BLUE (TOWEL DISPOSABLE) ×3 IMPLANT
WATER STERILE IRR 1000ML POUR (IV SOLUTION) ×3 IMPLANT

## 2014-02-14 NOTE — Anesthesia Preprocedure Evaluation (Signed)
Anesthesia Evaluation  Patient identified by MRN, date of birth, ID band Patient awake    Reviewed: Allergy & Precautions, H&P , NPO status , Patient's Chart, lab work & pertinent test results  Airway Mallampati: I  Neck ROM: Full  Mouth opening: Limited Mouth Opening  Dental  (+) Teeth Intact   Pulmonary neg pulmonary ROS,  breath sounds clear to auscultation        Cardiovascular Rhythm:Regular Rate:Normal     Neuro/Psych negative neurological ROS     GI/Hepatic GERD-  ,(+) Hepatitis -  Endo/Other    Renal/GU      Musculoskeletal   Abdominal   Peds  Hematology   Anesthesia Other Findings   Reproductive/Obstetrics                           Anesthesia Physical Anesthesia Plan  ASA: II  Anesthesia Plan: General   Post-op Pain Management:    Induction: Intravenous  Airway Management Planned: Oral ETT  Additional Equipment:   Intra-op Plan:   Post-operative Plan: Extubation in OR  Informed Consent: I have reviewed the patients History and Physical, chart, labs and discussed the procedure including the risks, benefits and alternatives for the proposed anesthesia with the patient or authorized representative who has indicated his/her understanding and acceptance.   Dental advisory given  Plan Discussed with: CRNA and Surgeon  Anesthesia Plan Comments:         Anesthesia Quick Evaluation

## 2014-02-14 NOTE — Anesthesia Postprocedure Evaluation (Signed)
  Anesthesia Post-op Note  Patient: Gina Lawrence  Procedure(s) Performed: Procedure(s) with comments: ANTERIOR CERVICAL DECOMPRESSION/DISCECTOMY FUSION 3 LEVELS (N/A) - C45 C56 C67 anterior cervical decompression with fusion interbody prosthesis and plating  Patient Location: PACU  Anesthesia Type:General  Level of Consciousness: awake, alert , oriented and patient cooperative  Airway and Oxygen Therapy: Patient Spontanous Breathing  Post-op Pain: mild  Post-op Assessment: Post-op Vital signs reviewed, Patient's Cardiovascular Status Stable, Respiratory Function Stable, Patent Airway and No signs of Nausea or vomiting  Post-op Vital Signs: stable  Last Vitals:  Filed Vitals:   02/14/14 1545  BP:   Pulse: 66  Temp:   Resp: 15    Complications: No apparent anesthesia complications

## 2014-02-14 NOTE — H&P (Signed)
CC:  Neck and arm pain  HPI: Gina Lawrence is a 48 year old woman seen for initial consultation in the office. Her primary complaint is neck and bilateral arm pain. These pains started approximately 1-1/2 months ago without any inciting event. The pain is worse when she is lying down. She says that she has a sensation of her arms being asleep, which involves primarily her forearm, and her hand. Occasionally when she goes to grab something, she will feel an electric shock like pain traveling down her arm and forearm into her hands. She does describe a little bit of trouble walking primarily when she first gets up where she feels her back and her legs are "stiff." As she gets going, the stiffness gets better and her balance improves. She is not describing any bowel or bladder changes. She has been taking over-the-counter Motrin and Naprosyn for the neck and arm symptoms which have provided questionable relief.  She initially tried a course of PT which was unsuccessful and now presents for surgical decompression/fusion.   PMH: Past Medical History  Diagnosis Date  . GERD (gastroesophageal reflux disease)   . Depression   . Frequency of urination   . Arthritis   . Numbness and tingling     bilateral arms  . Hepatitis     as a child    PSH: Past Surgical History  Procedure Laterality Date  . Tubal ligation    . Shoulder surgery Right     SH: History  Substance Use Topics  . Smoking status: Never Smoker   . Smokeless tobacco: Never Used  . Alcohol Use: 0.5 oz/week    1 drink(s) per week     Comment: once or twice weekly    MEDS: Prior to Admission medications   Medication Sig Start Date End Date Taking? Authorizing Provider  busPIRone (BUSPAR) 7.5 MG tablet Take 7.5 mg by mouth 3 (three) times daily as needed (for anxiety).   Yes Historical Provider, MD  calcium carbonate (OS-CAL - DOSED IN MG OF ELEMENTAL CALCIUM) 1250 MG tablet Take 1 tablet by mouth daily with breakfast.    Yes Historical Provider, MD  Cholecalciferol (VITAMIN D) 2000 UNITS CAPS Take 1 capsule by mouth daily.    Yes Historical Provider, MD  estrogen, conjugated,-medroxyprogesterone (PREMPRO) 0.625-2.5 MG per tablet Take 1 tablet by mouth daily.   Yes Historical Provider, MD  omeprazole (PRILOSEC) 20 MG capsule Take 20 mg by mouth daily as needed.   Yes Historical Provider, MD    ALLERGY: Allergies  Allergen Reactions  . Penicillins Hives  . Morphine And Related Nausea And Vomiting  . Nasal Spray Other (See Comments)    ROS: Review of Systems  Constitutional: Negative for fever and chills.  Eyes: Negative for blurred vision and double vision.  Respiratory: Negative for cough and shortness of breath.   Cardiovascular: Negative for chest pain.  Gastrointestinal: Negative for heartburn, nausea and vomiting.  Genitourinary: Negative for dysuria and frequency.  Musculoskeletal: Positive for neck pain. Negative for falls, joint pain and myalgias.  Skin: Negative for rash.  Neurological: Positive for tingling. Negative for dizziness, seizures and headaches.  Endo/Heme/Allergies: Does not bruise/bleed easily.  Psychiatric/Behavioral: Negative for depression.    NEUROLOGIC EXAM: Awake, alert, oriented Memory and concentration grossly intact Speech fluent, appropriate CN grossly intact Motor exam: Upper Extremities Deltoid Bicep Tricep Grip  Right 5/5 5/5 5/5 5/5  Left 5/5 5/5 5/5 5/5   Lower Extremity IP Quad PF DF EHL  Right 5/5 5/5  5/5 5/5 5/5  Left 5/5 5/5 5/5 5/5 5/5   Sensation grossly intact to LT  [facility]: MRI of the cervical spine was again reviewed, demonstrating a right eccentric C4 C5 disc herniation with effacement of the ventral CSF space, and some mild spinal cord flattening. At C5 C6, there is disc osteophyte complex causing moderate right-sided foraminal stenosis, and mild left-sided foraminal stenosis. At C6 C7 there is disc osteophyte complex worse on the left with  moderate to severe left-sided foraminal stenosis and mild to moderate right-sided foraminal stenosis.  IMPRESSION: 48 year old woman with neck pain and bilateral radiculopathies, and multilevel cervical spondylosis on MRI. She has failed conservative treatments.  PLAN: - surgical decompression via ACDF at C45, C5 C6, C6 C7  Treatment options were discussed with the patient in the office. Given the fact that she has failed conservative measures, surgical decompression is the remaining option.   The risks of surgery were discussed in detail with the patient which include but are not limited to spinal cord injury which may result in hand, leg, and bowel dysfunction, postoperative dysphagia, dysphonia, neck hematoma, or subsequent surgery for epidural hematoma. The risk of CSF leak was also discussed. In addition, I explained to him that after spinal fusion surgery, there is a risk of adjacent level disease requiring future surgical intervention. The patient understood our discussion as well as the risks of the surgery and is willing to proceed. All questions were answered.

## 2014-02-14 NOTE — Transfer of Care (Signed)
Immediate Anesthesia Transfer of Care Note  Patient: Mario Voong Caccavale  Procedure(s) Performed: Procedure(s) with comments: ANTERIOR CERVICAL DECOMPRESSION/DISCECTOMY FUSION 3 LEVELS (N/A) - C45 C56 C67 anterior cervical decompression with fusion interbody prosthesis and plating  Patient Location: PACU  Anesthesia Type:General  Level of Consciousness: awake, alert , oriented and patient cooperative  Airway & Oxygen Therapy: Patient Spontanous Breathing and Patient connected to nasal cannula oxygen  Post-op Assessment: Report given to PACU RN, Post -op Vital signs reviewed and stable and Patient moving all extremities X 4  Post vital signs: Reviewed and stable  Complications: No apparent anesthesia complications

## 2014-02-14 NOTE — Discharge Instructions (Signed)
Wound Care Keep incision covered and dry for two days.  Do not put any creams, lotions, or ointments on incision. Leave steri-strips on neck.  They will fall off by themselves. Activity Walk each and every day, increasing distance each day. No lifting greater than 5 lbs.  Avoid excessive neck motion. No driving for 2 weeks; may ride as a passenger locally. Wear neck collar at all times. Except for shower Diet Resume your normal diet.  Return to Work Will be discussed at you follow up appointment. Call Your Doctor If Any of These Occur Redness, drainage, or swelling at the wound.  Temperature greater than 101 degrees. Severe pain not relieved by pain medication. Incision starts to come apart. Follow Up Appt Call today for appointment in 1-2 weeks [phone]) or for problems.  If you have any hardware placed in your spine, you will need an x-ray before your appointment.

## 2014-02-14 NOTE — Op Note (Signed)
PREOP DIAGNOSIS:  1. Cervical Spondylosis with radiculopathy, C4-5, C5-6, C6-7  POSTOP DIAGNOSIS: Same  PROCEDURE: 1. Discectomy at C4-5, C5-6, C6-7 for decompression of spinal cord and exiting nerve roots  2. Placement of intervertebral biomechanical device  C4-5: Zimmer PEEK 42mm Lordotic cage  C5-6: Zimmer PEEK 12mm parallel cage  C6-7: Zimmer PEEK 57mm Lordotic cage 3. Placement of anterior instrumentation consisting of interbody plate and screws spanning C4-C7 4. Use of morselized bone allograft  5. Arthrodesis C45, C56, C67, anterior interbody technique  6. Use of intraoperative microscope  SURGEON: Dr. Consuella Lose, MD  ASSISTANT: Dr. Karie Chimera, MD  ANESTHESIA: General Endotracheal  EBL: 50cc  SPECIMENS: None  DRAINS: None  COMPLICATIONS: None immediate  CONDITION: Hemodynamically stable to PACU  HISTORY: Gina Lawrence is a 48 y.o. woman who was initially seen in the outpatient clinic. She had neck and bilateral arm pain and was found to have multilevel spondylosis with foraminal stenosis on MRI. She failed a trial of conservative management and elected to proceed with surgical decompression. After the risks and benefits of surgery were reviewed, informed consent was obtained after all her questions were answered.  PROCEDURE IN DETAIL: The patient was brought to the operating room and transferred to the operative table. After induction of general anesthesia, the patient was positioned on the operative table in the supine position with all pressure points meticulously padded. The skin of the neck was then prepped and draped in the usual sterile fashion.  After timeout was conducted, the skin was infiltrated with local anesthetic. Skin incision along the anterior SCM  was then made sharply and Bovie electrocautery was used to dissect the subcutaneous tissue until the platysma was identified. The platysma was then divided and undermined. The sternocleidomastoid  muscle was then identified and, utilizing natural fascial planes in the neck, the prevertebral fascia was identified and the carotid sheath was retracted laterally and the trachea and esophagus retracted medially. Again using fluoroscopy, the correct disc spaces were identified. Bovie electrocautery was used to dissect in the subperiosteal plane and elevate the bilateral longus coli muscles. Self-retaining retractors were then placed. At this point, the microscope was draped and brought into the field, and the remainder of the case was done under the microscope using microdissecting technique.  The C5-6 disc space was incised sharply and rongeurs were use to initially complete a discectomy. The high-speed drill was then used to complete discectomy until the posterior annulus was identified and removed and the posterior longitudinal ligament was identified. Using a nerve hook, the PLL was elevated, and Kerrison rongeurs were used to remove the posterior longitudinal ligament and the ventral thecal sac was identified. Using a combination of curettes and rongeurs, complete decompression of the thecal sac and exiting nerve roots at this level was completed, and verified using micro-nerve hook. The disc space was noted to be fairly collapsed, and there was noticeable spondylotic tissue in the region of bilateral neural foramina.  Attention was then turned to the C4-5 level. In a similar fashion, discectomy was completed initially with curettes and rongeurs, and completed with the drill. The PLL was again identified, elevated and incised. Using Kerrison rongeurs, decompression of the spinal cord and exiting roots at this level was completed and confirmed with a dissector.  Attention was then turned to the C6-7 level. In a similar fashion, discectomy was completed initially with curettes and rongeurs, and completed with the drill. The PLL was again identified, elevated and incised. Using Kerrison rongeurs,  decompression of the spinal cord and exiting roots at this level was completed and confirmed with a dissector.  The above interbody cages were then sized and filled with bone allograft, and tapped into place. Position of the interbody devices was then confirmed with fluoroscopy.  After placement of the intervertebral devices, the anterior cervical plate was selected, and placed across the interspaces. Using a high-speed drill, the cortex of the cervical vertebral bodies was punctured, and screws inserted in the C4, C5, C6, and C7 levels. Final fluoroscopic images in AP and lateral projections were taken to confirm good hardware placement.  At this point, after all counts were verified to be correct, meticulous hemostasis was secured using a combination of bipolar electrocautery and passive hemostatics. The platysma muscle was then closed using interrupted 3-0 Vicryl sutures, and the skin was closed with an interrupted subcuticular stitch. Sterile dressings were then applied and the drapes removed.  The patient tolerated the procedure well and was extubated in the room and taken to the postanesthesia care unit in stable condition.

## 2014-02-14 NOTE — Plan of Care (Signed)
Problem: Consults Goal: Diagnosis - Spinal Surgery Outcome: Completed/Met Date Met:  02/14/14 Cervical Spine Fusion

## 2014-02-14 NOTE — Progress Notes (Signed)
ANTIBIOTIC CONSULT NOTE - INITIAL  Pharmacy Consult for Vancomycin Indication: Post op prophylaxsis  Allergies  Allergen Reactions  . Penicillins Hives  . Morphine And Related Nausea And Vomiting  . Nasal Spray Other (See Comments)    Patient Measurements: Weight: 183 lb (83.008 kg)  Vital Signs: Temp: 98.3 F (36.8 C) (10/13 1615) Temp Source: Oral (10/13 0930) BP: 136/77 mmHg (10/13 1615) Pulse Rate: 80 (10/13 1615) Intake/Output from previous day:   Intake/Output from this shift: Total I/O In: 1200 [I.V.:1200] Out: -   Labs: No results found for this basename: WBC, HGB, PLT, LABCREA, CREATININE,  in the last 72 hours The CrCl is unknown because both a height and weight (above a minimum accepted value) are required for this calculation. No results found for this basename: VANCOTROUGH, Corlis Leak, VANCORANDOM, GENTTROUGH, GENTPEAK, GENTRANDOM, TOBRATROUGH, TOBRAPEAK, TOBRARND, AMIKACINPEAK, AMIKACINTROU, AMIKACIN,  in the last 72 hours   Microbiology: Recent Results (from the past 720 hour(s))  SURGICAL PCR SCREEN     Status: None   Collection Time    02/09/14  2:45 PM      Result Value Ref Range Status   MRSA, PCR NEGATIVE  NEGATIVE Final   Staphylococcus aureus NEGATIVE  NEGATIVE Final   Comment:            The Xpert SA Assay (FDA     approved for NASAL specimens     in patients over 69 years of age),     is one component of     a comprehensive surveillance     program.  Test performance has     been validated by [employer] for patients greater     than or equal to 86 year old.     It is not intended     to diagnose infection nor to     guide or monitor treatment.    Medical History: Past Medical History  Diagnosis Date  . GERD (gastroesophageal reflux disease)   . Depression   . Frequency of urination   . Arthritis   . Numbness and tingling     bilateral arms  . Hepatitis     as a child    Medications:  Prescriptions prior to admission   Medication Sig Dispense Refill  . busPIRone (BUSPAR) 7.5 MG tablet Take 7.5 mg by mouth 3 (three) times daily as needed (for anxiety).      . calcium carbonate (OS-CAL - DOSED IN MG OF ELEMENTAL CALCIUM) 1250 MG tablet Take 1 tablet by mouth daily with breakfast.      . Cholecalciferol (VITAMIN D) 2000 UNITS CAPS Take 1 capsule by mouth daily.       Marland Kitchen estrogen, conjugated,-medroxyprogesterone (PREMPRO) 0.625-2.5 MG per tablet Take 1 tablet by mouth daily.      Marland Kitchen omeprazole (PRILOSEC) 20 MG capsule Take 20 mg by mouth daily as needed.       Assessment: 48 yo F admitted 02/14/2014  Post op from Discectomy. Pharmacy consulted to dose vancomycin x 1 dose  Goal of Therapy:  Vancomycin trough level 10-15 mcg/ml  Plan:  1. Vancomycin 1g IV x1 at 2330. 2. Pharmacy will sign off, call if questions.  [place] 02/14/2014,4:43 PM

## 2014-02-14 NOTE — Progress Notes (Signed)
Pt. Awakens to state her neck is hurting,then falls back to sleep and has to be reminded to breath

## 2014-02-15 ENCOUNTER — Encounter (HOSPITAL_COMMUNITY): Payer: Self-pay | Admitting: Neurosurgery

## 2014-02-15 DIAGNOSIS — M4722 Other spondylosis with radiculopathy, cervical region: Secondary | ICD-10-CM | POA: Diagnosis not present

## 2014-02-15 MED ORDER — OXYCODONE-ACETAMINOPHEN 5-325 MG PO TABS: 1.0000 | ORAL_TABLET | ORAL | Status: DC | PRN

## 2014-02-15 MED ORDER — DIAZEPAM 5 MG PO TABS: 5.0000 mg | ORAL_TABLET | Freq: Four times a day (QID) | ORAL | Status: DC | PRN

## 2014-02-15 NOTE — Discharge Summary (Signed)
  Physician Discharge Summary  Patient ID: Gina Lawrence MRN: [id number] DOB/AGE: 50-Jun-1967 50 y.o.  Admit date: 02/14/2014 Discharge date: 02/15/2014  Admission Diagnoses: Cervical spondylosis with radiculopathy  Discharge Diagnoses: Same Active Problems:   Cervical spondylosis with radiculopathy   Discharged Condition: Stable  Hospital Course:  Gina Lawrence is a 48 y.o. female electively admitted after uncomplicated T2-P4 ACDF. She was at her neurologic baseline postop, and on POD#1 reported significant improvement in her bilateral arm symptoms. She was ambulating normally, had some difficulty swallowing but was able to tolerate liquids, and was voiding normally.  Treatments: Surgery - ACDF C45, C56, C67  Discharge Exam: Blood pressure 130/76, pulse 87, temperature 98.7 F (37.1 C), temperature source Oral, resp. rate 18, weight 83.008 kg (183 lb), SpO2 99.00%. Awake, alert, oriented Speech fluent, appropriate CN grossly intact 5/5 BUE/BLE Wound c/d/i  Follow-up: Follow-up in my office [facility] [phone]) in 2-3 weeks  Disposition: Final discharge disposition not confirmed     Medication List         busPIRone 7.5 MG tablet  Commonly known as:  BUSPAR  Take 7.5 mg by mouth 3 (three) times daily as needed (for anxiety).     calcium carbonate 1250 MG tablet  Commonly known as:  OS-CAL - dosed in mg of elemental calcium  Take 1 tablet by mouth daily with breakfast.     diazepam 5 MG tablet  Commonly known as:  VALIUM  Take 1 tablet (5 mg total) by mouth every 6 (six) hours as needed for muscle spasms.     omeprazole 20 MG capsule  Commonly known as:  PRILOSEC  Take 20 mg by mouth daily as needed.     oxyCODONE-acetaminophen 5-325 MG per tablet  Commonly known as:  PERCOCET/ROXICET  Take 1-2 tablets by mouth every 4 (four) hours as needed for moderate pain.     PREMPRO 0.625-2.5 MG per tablet  Generic drug:  estrogen  (conjugated)-medroxyprogesterone  Take 1 tablet by mouth daily.     Vitamin D 2000 UNITS Caps  Take 1 capsule by mouth daily.           Follow-up Information   Follow up with Jairo Ben, MD.   Specialty:  Neurosurgery   Contact information:   [address], [address] [place] [address] [phone]       Signed: Consuella Lose, C 02/15/2014, 8:21 AM

## 2014-02-15 NOTE — Progress Notes (Signed)
Patient alert and oriented, mae's well, voiding adequate amount of urine, swallowing without difficulty, no c/o pain. Patient discharged home with family. Script and discharged instructions given to patient. Patient and family stated understanding of d/c instructions given and has an appointment with MD. Aisha Osker Ayoub RN 

## 2015-03-29 IMAGING — MG MM DIGITAL SCREENING
4 series · 4 of 4 positions shown · non-contrast
Comparison: None.

CLINICAL DATA: Screening.

EXAM:
DIGITAL SCREENING BILATERAL MAMMOGRAM WITH CAD

[L CC]
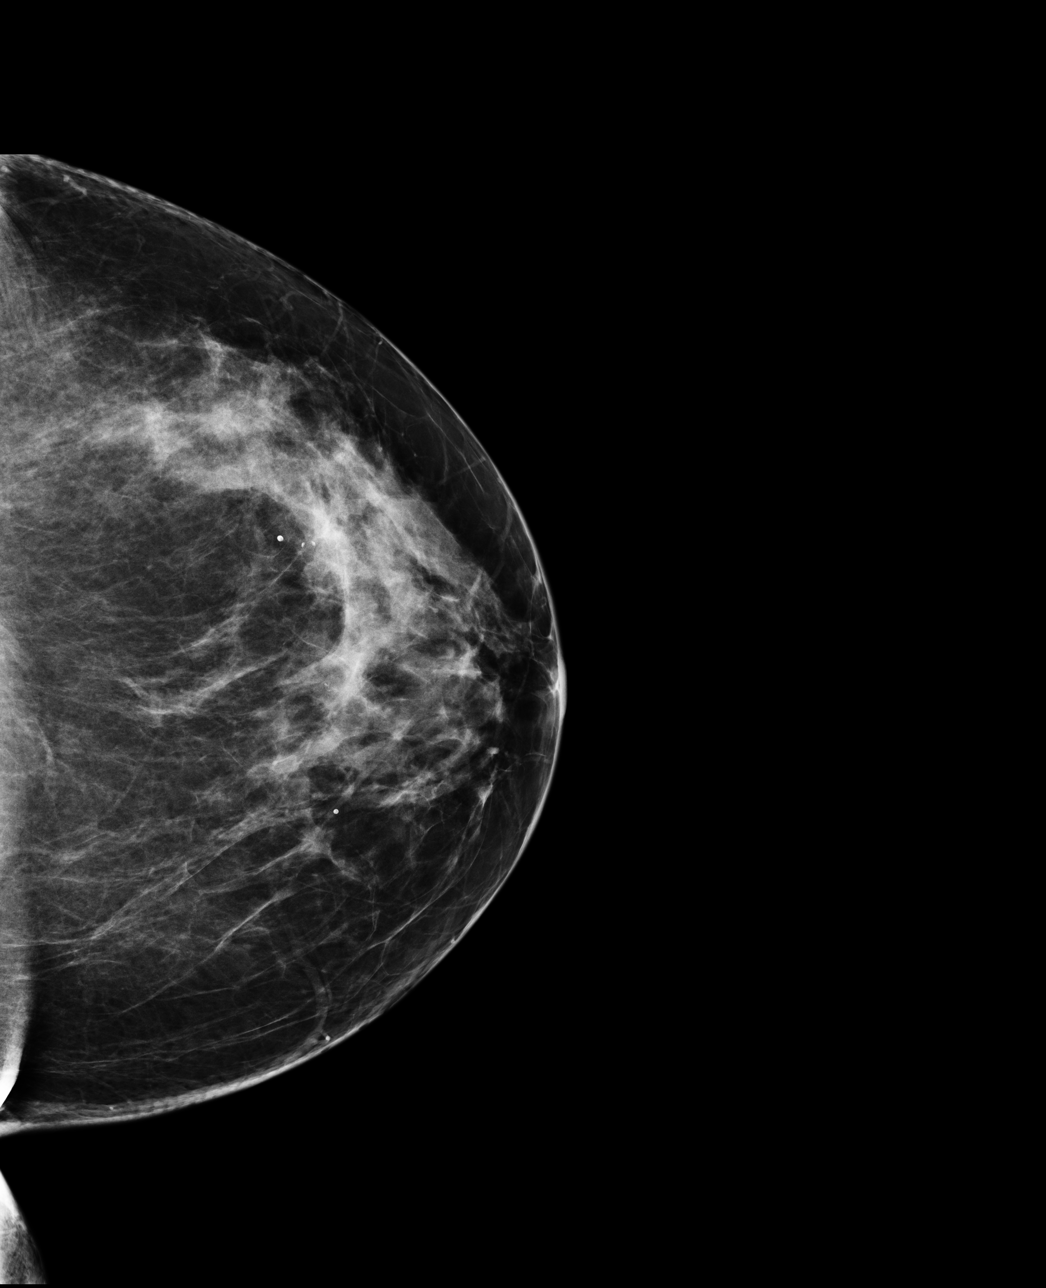

[L MLO]
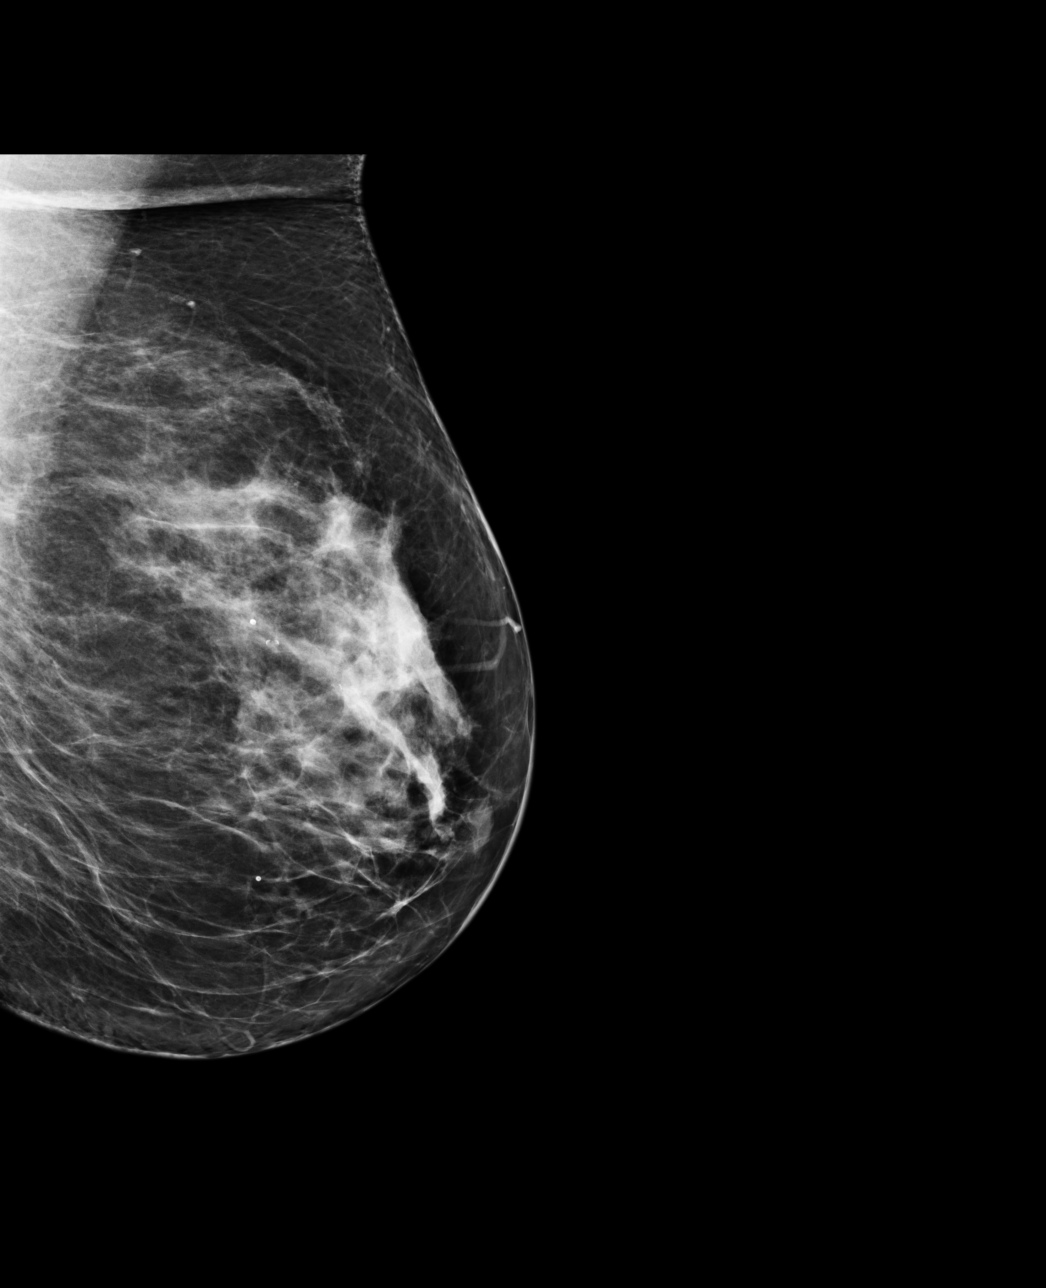

[R CC]
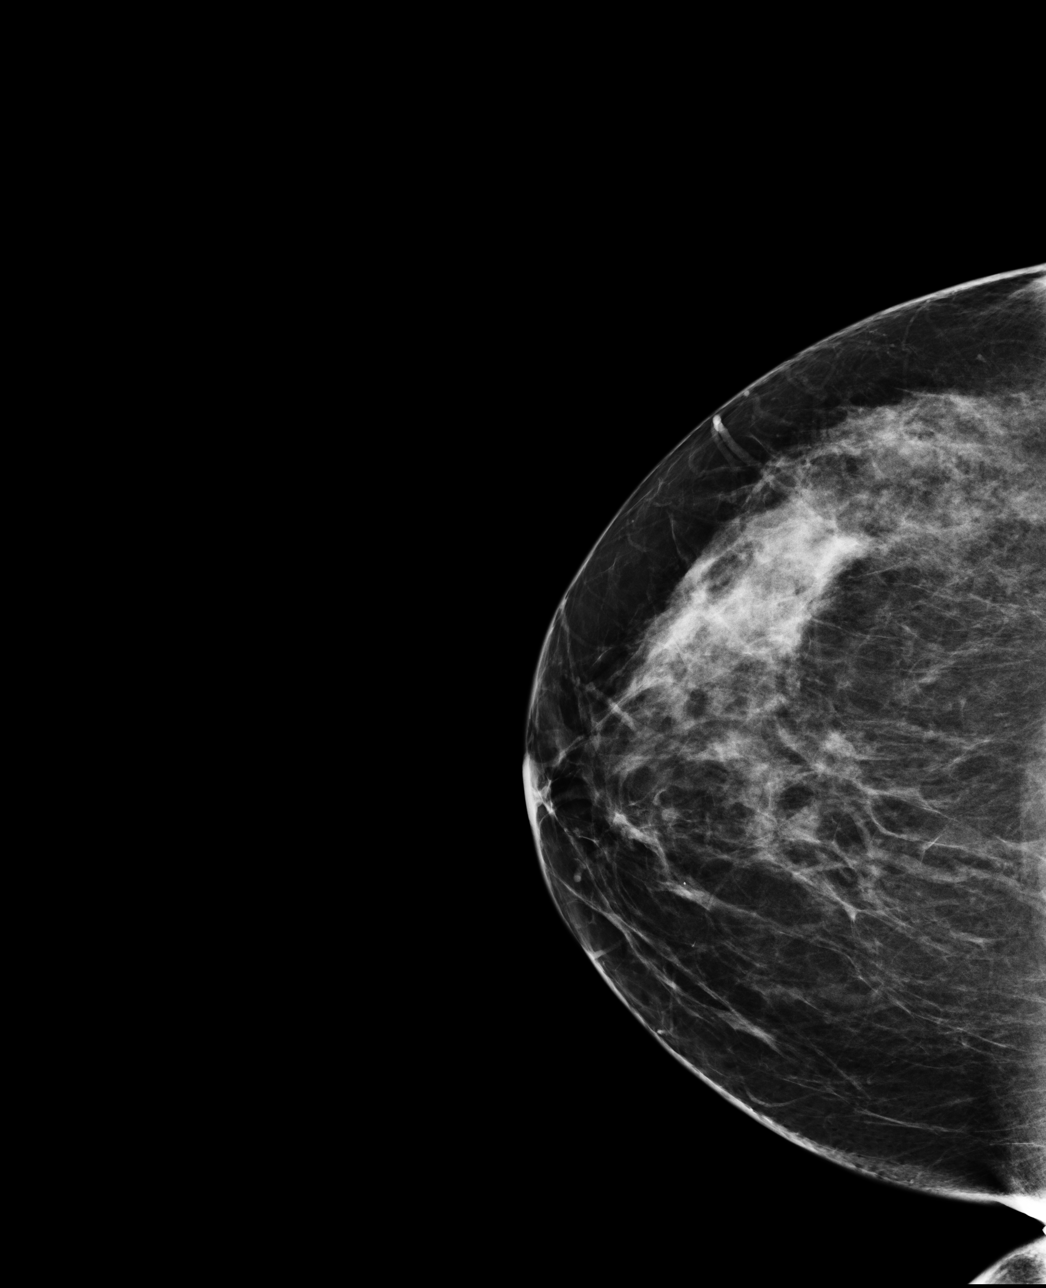

[R MLO]
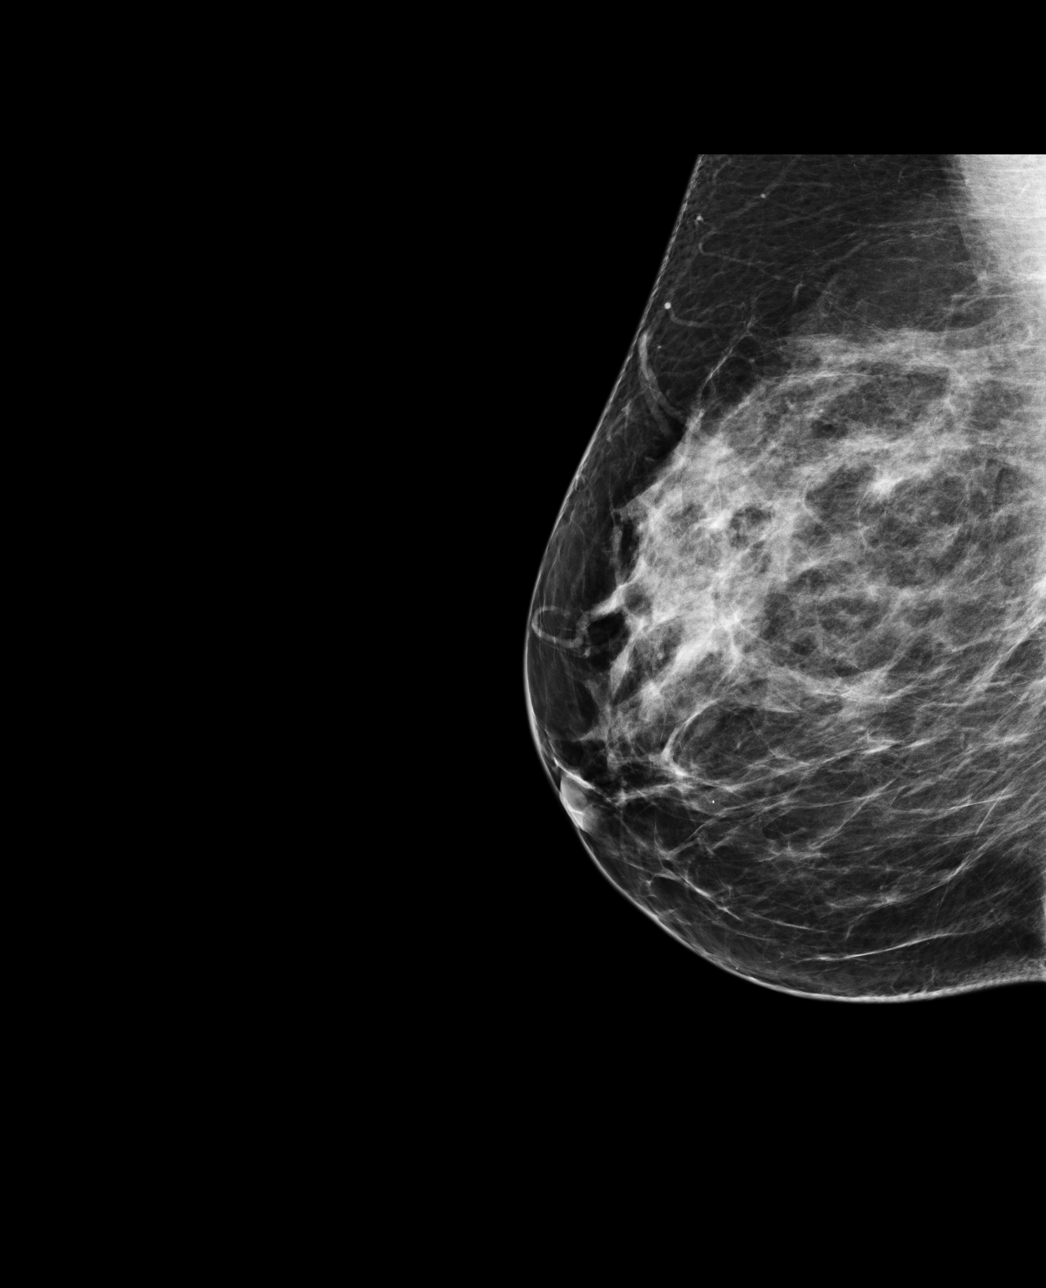

[4 of 4 positions shown; findings below may reference images not displayed]

ACR Breast Density Category c: The breast tissue is heterogeneously
dense, which may obscure small masses
FINDINGS: There are no findings suspicious for malignancy. Images were
processed with CAD.
IMPRESSION: No mammographic evidence of malignancy. A result letter of this
screening mammogram will be mailed directly to the patient.

RECOMMENDATION:
Screening mammogram in one year. (Code:U2-0-761)

BI-RADS CATEGORY  1: Negative.

## 2015-10-29 ENCOUNTER — Encounter: Payer: Self-pay | Admitting: Pediatrics

## 2015-11-22 ENCOUNTER — Encounter: Payer: Self-pay | Admitting: Pediatrics

## 2015-11-22 ENCOUNTER — Ambulatory Visit (INDEPENDENT_AMBULATORY_CARE_PROVIDER_SITE_OTHER): Payer: Managed Care, Other (non HMO) | Admitting: Pediatrics

## 2015-11-22 VITALS — BP 117/75 | HR 73 | Temp 98.7°F | Ht 66.0 in | Wt 164.2 lb

## 2015-11-22 DIAGNOSIS — Z124 Encounter for screening for malignant neoplasm of cervix: Secondary | ICD-10-CM

## 2015-11-22 DIAGNOSIS — Z0000 Encounter for general adult medical examination without abnormal findings: Secondary | ICD-10-CM

## 2015-11-22 DIAGNOSIS — F411 Generalized anxiety disorder: Secondary | ICD-10-CM

## 2015-11-22 MED ORDER — BETAMETHASONE VALERATE 0.1 % EX CREA: 1.0000 "application " | TOPICAL_CREAM | Freq: Two times a day (BID) | CUTANEOUS | Status: DC | PRN

## 2015-11-22 NOTE — Progress Notes (Signed)
Subjective:    Patient ID: Gina Lawrence, female    DOB: Nov 07, 1965, 50 y.o.   MRN: [id number]  CC: Gynecologic Exam  HPI: Gina Lawrence is a 50 y.o. female presenting for Gynecologic Exam  At 16-17yo had abnormal pap smear needing cryo therapy Since then having normal pap smears regularly  Feeling fine overall Has some anxiety Has been tried on buspirone, was too expensive and didn't help much so stopped Has taken xanax in the past Does fine most days, a couple times a month at work has anxiety problems  Cervical fusion 1 year ago, healing well.  Fusion for arthritis Was having decreasing strength in hands b/l Switched to ketogenic diet, also helped with arthritis symptoms   Mammogram done at work, mobile unit  Work does labs every other year, will bring results to [place]  Fam hx: no colon ca, +diabetes and asthma, schizophrenia  Depression screen PHQ 2/9 11/22/2015  Decreased Interest 2  Down, Depressed, Hopeless 1  PHQ - 2 Score 3  Altered sleeping 3  Tired, decreased energy 2  Change in appetite 0  Feeling bad or failure about yourself  1  Trouble concentrating 2  Moving slowly or fidgety/restless 0  Suicidal thoughts 0  PHQ-9 Score 11  Difficult doing work/chores Somewhat difficult    Social History   Social History  . Marital Status: Married    Spouse Name: Gina Lawrence  . Number of Children: N/A  . Years of Education: N/A   Occupational History  . Public affairs consultant    Social History Main Topics  . Smoking status: Never Smoker   . Smokeless tobacco: Never Used  . Alcohol Use: 0.5 oz/week    1 drink(s) per week     Comment: once or twice weekly  . Drug Use: No  . Sexual Activity:    Partners: Male   Other Topics Concern  . Not on file   Social History Narrative     ROS: All systems negative other than what is in HPI     Objective:    BP 117/75 mmHg  Pulse 73  Temp(Src) 98.7 F (37.1 C) (Oral)  Ht 5\' 6"  (1.676 m)  Wt 164 lb 3.2 oz  (74.481 kg)  BMI 26.52 kg/m2  Wt Readings from Last 3 Encounters:  11/22/15 164 lb 3.2 oz (74.481 kg)  02/14/14 183 lb (83.008 kg)  02/09/14 183 lb 8 oz (83.235 kg)     Gen: NAD, alert, cooperative with exam, NCAT EYES: EOMI, no scleral injection or icterus ENT:  TMs pearly gray b/l, OP without erythema LYMPH: no cervical LAD CV: NRRR, normal S1/S2, no murmur, distal pulses 2+ b/l Resp: CTABL, no wheezes, normal WOB Abd: +BS, soft, NTND. no guarding or organomegaly Ext: No edema, warm Neuro: Alert and oriented, strength equal b/l UE and LE, coordination grossly normal MSK: normal muscle bulk Bresat: normal exam b/l GU: normal external genitalia, normal appearing cervix     Assessment & Plan:    Gina Lawrence was seen today for gynecologic exam.  Diagnoses and all orders for this visit:  Encounter for preventive health examination Doing well, gets labs at work Will bring to clinic  Screening for cervical cancer -     Pap IG and HPV (high risk) DNA detection  Anxiety state Has tried buspirone Rarely needed xanax Discussed I'd rather start SSRI than do any more benzodiazepine SSRI may also help with hot flash symptoms She wants to wait on any further medications  Other orders -     betamethasone valerate (VALISONE) 0.1 % cream; Apply 1 application topically 2 (two) times daily as needed.  Screen colon cancer  Not sure when last colonoscopy was done or next due. H/o polyps. done at [facility], will get records  Follow up plan: Return in about 1 year (around 11/21/2016).  Gina Found, MD [facility] 11/22/2015, 1:11 PM

## 2015-11-26 LAB — PAP IG AND HPV HIGH-RISK
HPV, HIGH-RISK: NEGATIVE
PAP Smear Comment: 0

## 2016-06-16 ENCOUNTER — Ambulatory Visit (INDEPENDENT_AMBULATORY_CARE_PROVIDER_SITE_OTHER): Payer: Managed Care, Other (non HMO) | Admitting: Family Medicine

## 2016-06-16 ENCOUNTER — Encounter: Payer: Self-pay | Admitting: Family Medicine

## 2016-06-16 VITALS — BP 123/79 | HR 76 | Temp 99.1°F | Ht 66.0 in | Wt 171.0 lb

## 2016-06-16 DIAGNOSIS — R509 Fever, unspecified: Secondary | ICD-10-CM | POA: Diagnosis not present

## 2016-06-16 DIAGNOSIS — J0100 Acute maxillary sinusitis, unspecified: Secondary | ICD-10-CM | POA: Diagnosis not present

## 2016-06-16 LAB — VERITOR FLU A/B WAIVED
INFLUENZA B: NEGATIVE
Influenza A: NEGATIVE

## 2016-06-16 MED ORDER — LEVOFLOXACIN 500 MG PO TABS: 500.0000 mg | ORAL_TABLET | Freq: Every day | ORAL | 0 refills | Status: DC

## 2016-06-16 NOTE — Progress Notes (Signed)
Subjective:  Patient ID: Hulan Amato Brougham, female    DOB: 24-Feb-1966  Age: 51 y.o. MRN: [id number]  CC: Sinusitis (pt here today c/o sinus pressure, runny nose, congestion, sneezing, cough, pressure in ears and sore throat.)   HPI Mckinzley Sonnentag Heath presents for Patient presents with upper respiratory congestion. Rhinorrhea that is frequently purulent. There is moderate sore throat. Patient reports coughing frequently as well.-colored/purulent sputum noted. There is no fever no chills no sweats. The patient denies being short of breath. Onset was 3-5 days ago. Gradually worsening in spite of home remedies.    History Jett has a past medical history of Arthritis; Depression; Frequency of urination; GERD (gastroesophageal reflux disease); Hepatitis; and Numbness and tingling.   She has a past surgical history that includes Tubal ligation; Shoulder surgery (Right); and Anterior cervical decomp/discectomy fusion (N/A, 02/14/2014).   Her family history includes Diabetes in her father.She reports that she has never smoked. She has never used smokeless tobacco. She reports that she drinks about 0.5 oz of alcohol per week . She reports that she does not use drugs.    ROS Review of Systems  Constitutional: Positive for activity change, appetite change and fever. Negative for chills.  HENT: Positive for congestion, postnasal drip, rhinorrhea and sinus pressure. Negative for ear discharge, ear pain, hearing loss, nosebleeds, sneezing and trouble swallowing.   Respiratory: Negative for chest tightness and shortness of breath.   Cardiovascular: Negative for chest pain and palpitations.  Skin: Negative for rash.    Objective:  BP 123/79   Pulse 76   Temp 99.1 F (37.3 C) (Oral)   Ht 5\' 6"  (1.676 m)   Wt 171 lb (77.6 kg)   BMI 27.60 kg/m   BP Readings from Last 3 Encounters:  06/16/16 123/79  11/22/15 117/75  02/15/14 130/76    Wt Readings from Last 3 Encounters:  06/16/16 171 lb  (77.6 kg)  11/22/15 164 lb 3.2 oz (74.5 kg)  02/14/14 183 lb (83 kg)     Physical Exam  Constitutional: She appears well-developed and well-nourished.  HENT:  Head: Normocephalic and atraumatic.  Right Ear: Tympanic membrane and external ear normal. No decreased hearing is noted.  Left Ear: Tympanic membrane and external ear normal. No decreased hearing is noted.  Nose: Mucosal edema present. Right sinus exhibits no frontal sinus tenderness. Left sinus exhibits no frontal sinus tenderness.  Mouth/Throat: No oropharyngeal exudate or posterior oropharyngeal erythema.  Neck: No Brudzinski's sign noted.  Pulmonary/Chest: Breath sounds normal. No respiratory distress.  Lymphadenopathy:       Head (right side): No preauricular adenopathy present.       Head (left side): No preauricular adenopathy present.       Right cervical: No superficial cervical adenopathy present.      Left cervical: No superficial cervical adenopathy present.    No results found.  Assessment & Plan:   Symira was seen today for sinusitis.  Diagnoses and all orders for this visit:  Fever, unspecified fever cause -     Veritor Flu A/B Waived  Acute maxillary sinusitis, recurrence not specified  Other orders -     levofloxacin (LEVAQUIN) 500 MG tablet; Take 1 tablet (500 mg total) by mouth daily. For 10 days    I am having Ms. Hughson start on levofloxacin. I am also having her maintain her Vitamin D, magnesium, betamethasone valerate, and diclofenac.  Allergies as of 06/16/2016      Reactions   Penicillins Hives  Morphine And Related Nausea And Vomiting   Nasal Spray Other (See Comments)      Medication List       Accurate as of 06/16/16  3:02 PM. Always use your most recent med list.          betamethasone valerate 0.1 % cream Commonly known as:  VALISONE Apply 1 application topically 2 (two) times daily as needed.   diclofenac 75 MG EC tablet Commonly known as:  VOLTAREN Take 75 mg by  mouth 2 (two) times daily.   levofloxacin 500 MG tablet Commonly known as:  LEVAQUIN Take 1 tablet (500 mg total) by mouth daily. For 10 days   magnesium 30 MG tablet Take 30 mg by mouth 2 (two) times daily.   Vitamin D 2000 units Caps Take 1 capsule by mouth daily.        Follow-up: Return if symptoms worsen or fail to improve.  Claretta Fraise, M.D.

## 2018-03-09 ENCOUNTER — Encounter (INDEPENDENT_AMBULATORY_CARE_PROVIDER_SITE_OTHER): Payer: Self-pay | Admitting: *Deleted

## 2020-06-21 DIAGNOSIS — I10 Essential (primary) hypertension: Secondary | ICD-10-CM | POA: Insufficient documentation

## 2020-07-18 ENCOUNTER — Other Ambulatory Visit: Payer: Self-pay

## 2020-07-18 ENCOUNTER — Encounter: Payer: Self-pay | Admitting: Family Medicine

## 2020-07-18 ENCOUNTER — Ambulatory Visit (INDEPENDENT_AMBULATORY_CARE_PROVIDER_SITE_OTHER): Payer: Managed Care, Other (non HMO) | Admitting: Family Medicine

## 2020-07-18 VITALS — BP 121/77 | HR 62 | Temp 98.1°F | Ht 66.0 in | Wt 195.1 lb

## 2020-07-18 DIAGNOSIS — E782 Mixed hyperlipidemia: Secondary | ICD-10-CM | POA: Diagnosis not present

## 2020-07-18 DIAGNOSIS — Z6831 Body mass index (BMI) 31.0-31.9, adult: Secondary | ICD-10-CM | POA: Diagnosis not present

## 2020-07-18 DIAGNOSIS — F320 Major depressive disorder, single episode, mild: Secondary | ICD-10-CM | POA: Insufficient documentation

## 2020-07-18 DIAGNOSIS — R030 Elevated blood-pressure reading, without diagnosis of hypertension: Secondary | ICD-10-CM

## 2020-07-18 DIAGNOSIS — Z1231 Encounter for screening mammogram for malignant neoplasm of breast: Secondary | ICD-10-CM

## 2020-07-18 DIAGNOSIS — Z7689 Persons encountering health services in other specified circumstances: Secondary | ICD-10-CM

## 2020-07-18 DIAGNOSIS — R7303 Prediabetes: Secondary | ICD-10-CM | POA: Diagnosis not present

## 2020-07-18 DIAGNOSIS — K219 Gastro-esophageal reflux disease without esophagitis: Secondary | ICD-10-CM

## 2020-07-18 DIAGNOSIS — Z1159 Encounter for screening for other viral diseases: Secondary | ICD-10-CM

## 2020-07-18 DIAGNOSIS — F411 Generalized anxiety disorder: Secondary | ICD-10-CM | POA: Insufficient documentation

## 2020-07-18 LAB — THYROID PANEL WITH TSH

## 2020-07-18 LAB — CBC WITH DIFFERENTIAL/PLATELET
Hematocrit: 45.8 % (ref 34.0–46.6)
Hemoglobin: 15.5 g/dL (ref 11.1–15.9)
Immature Granulocytes: 0 %
MCH: 28.9 pg (ref 26.6–33.0)
Neutrophils Absolute: 2.2 10*3/uL (ref 1.4–7.0)
RBC: 5.36 x10E6/uL — ABNORMAL HIGH (ref 3.77–5.28)
WBC: 4.9 10*3/uL (ref 3.4–10.8)

## 2020-07-18 LAB — LIPID PANEL

## 2020-07-18 LAB — CMP14+EGFR

## 2020-07-18 MED ORDER — PANTOPRAZOLE SODIUM 20 MG PO TBEC: 20.0000 mg | DELAYED_RELEASE_TABLET | Freq: Every day | ORAL | 3 refills | Status: DC

## 2020-07-18 MED ORDER — CITALOPRAM HYDROBROMIDE 20 MG PO TABS: 20.0000 mg | ORAL_TABLET | Freq: Every day | ORAL | 5 refills | Status: DC

## 2020-07-18 NOTE — Patient Instructions (Addendum)
Managing Anxiety, Adult After being diagnosed with an anxiety disorder, you may be relieved to know why you have felt or behaved a certain way. You may also feel overwhelmed about the treatment ahead and what it will mean for your life. With care and support, you can manage this condition and recover from it. How to manage lifestyle changes Managing stress and anxiety Stress is your body's reaction to life changes and events, both good and bad. Most stress will last just a few hours, but stress can be ongoing and can lead to more than just stress. Although stress can play a major role in anxiety, it is not the same as anxiety. Stress is usually caused by something external, such as a deadline, test, or competition. Stress normally passes after the triggering event has ended.  Anxiety is caused by something internal, such as imagining a terrible outcome or worrying that something will go wrong that will devastate you. Anxiety often does not go away even after the triggering event is over, and it can become long-term (chronic) worry. It is important to understand the differences between stress and anxiety and to manage your stress effecti Mediterranean Diet A Mediterranean diet refers to food and lifestyle choices that are based on the traditions of countries located on the [employer]. This way of eating has been shown to help prevent certain conditions and improve outcomes for people who have chronic diseases, like kidney disease and heart disease. What are tips for following this plan? Lifestyle  Cook and eat meals together with your family, when possible.  Drink enough fluid to keep your urine clear or pale yellow.  Be physically active every day. This includes: ? Aerobic exercise like running or swimming. ? Leisure activities like gardening, walking, or housework.  Get 7-8 hours of sleep each night.  If recommended by your health care provider, drink red wine in moderation. This means 1  glass a day for nonpregnant women and 2 glasses a day for men. A glass of wine equals 5 oz (150 mL). Reading food labels  Check the serving size of packaged foods. For foods such as rice and pasta, the serving size refers to the amount of cooked product, not dry.  Check the total fat in packaged foods. Avoid foods that have saturated fat or trans fats.  Check the ingredients list for added sugars, such as corn syrup.   Shopping  At the grocery store, buy most of your food from the areas near the walls of the store. This includes: ? Fresh fruits and vegetables (produce). ? Grains, beans, nuts, and seeds. Some of these may be available in unpackaged forms or large amounts (in bulk). ? Fresh seafood. ? Poultry and eggs. ? Low-fat dairy products.  Buy whole ingredients instead of prepackaged foods.  Buy fresh fruits and vegetables in-season from local farmers markets.  Buy frozen fruits and vegetables in resealable bags.  If you do not have access to quality fresh seafood, buy precooked frozen shrimp or canned fish, such as tuna, salmon, or sardines.  Buy small amounts of raw or cooked vegetables, salads, or olives from the deli or salad bar at your store.  Stock your pantry so you always have certain foods on hand, such as olive oil, canned tuna, canned tomatoes, rice, pasta, and beans. Cooking  Cook foods with extra-virgin olive oil instead of using butter or other vegetable oils.  Have meat as a side dish, and have vegetables or grains as your main dish.  This means having meat in small portions or adding small amounts of meat to foods like pasta or stew.  Use beans or vegetables instead of meat in common dishes like chili or lasagna.  Experiment with different cooking methods. Try roasting or broiling vegetables instead of steaming or sauteing them.  Add frozen vegetables to soups, stews, pasta, or rice.  Add nuts or seeds for added healthy fat at each meal. You can add these  to yogurt, salads, or vegetable dishes.  Marinate fish or vegetables using olive oil, lemon juice, garlic, and fresh herbs. Meal planning  Plan to eat 1 vegetarian meal one day each week. Try to work up to 2 vegetarian meals, if possible.  Eat seafood 2 or more times a week.  Have healthy snacks readily available, such as: ? Vegetable sticks with hummus. ? [place] yogurt. ? Fruit and nut trail mix.  Eat balanced meals throughout the week. This includes: ? Fruit: 2-3 servings a day ? Vegetables: 4-5 servings a day ? Low-fat dairy: 2 servings a day ? Fish, poultry, or lean meat: 1 serving a day ? Beans and legumes: 2 or more servings a week ? Nuts and seeds: 1-2 servings a day ? Whole grains: 6-8 servings a day ? Extra-virgin olive oil: 3-4 servings a day  Limit red meat and sweets to only a few servings a month   What are my food choices?  Mediterranean diet ? Recommended  Grains: Whole-grain pasta. Brown rice. Bulgar wheat. Polenta. Couscous. Whole-wheat bread. [place].  Vegetables: Artichokes. Beets. Broccoli. Cabbage. Carrots. Eggplant. Green beans. Chard. Kale. Spinach. Onions. Leeks. Peas. Squash. Tomatoes. Peppers. Radishes.  Fruits: Apples. Apricots. Avocado. Berries. Bananas. Cherries. Dates. Figs. Grapes. Lemons. Melon. Oranges. Peaches. Plums. Pomegranate.  Meats and other protein foods: Beans. Almonds. Sunflower seeds. Pine nuts. Peanuts. [place]. Salmon. Scallops. Shrimp. [place]. Tilapia. Clams. Oysters. Eggs.  Dairy: Low-fat milk. Cheese. Greek yogurt.  Beverages: Water. Red wine. Herbal tea.  Fats and oils: Extra virgin olive oil. Avocado oil. Grape seed oil.  Sweets and desserts: [place] yogurt with honey. Baked apples. Poached pears. Trail mix.  Seasoning and other foods: Basil. Cilantro. Coriander. Cumin. Mint. Parsley. Sage. Rosemary. Tarragon. Garlic. Oregano. Thyme. Pepper. Balsalmic vinegar. Tahini. Hummus. Tomato sauce. Olives. Mushrooms. ? Limit  these  Grains: Prepackaged pasta or rice dishes. Prepackaged cereal with added sugar.  Vegetables: Deep fried potatoes (french fries).  Fruits: Fruit canned in syrup.  Meats and other protein foods: Beef. Pork. Lamb. Poultry with skin. Hot dogs. [place] Salines.  Dairy: Ice cream. Sour cream. Whole milk.  Beverages: Juice. Sugar-sweetened soft drinks. Beer. Liquor and spirits.  Fats and oils: Butter. Canola oil. Vegetable oil. Beef fat (tallow). Lard.  Sweets and desserts: Cookies. Cakes. Pies. Candy.  Seasoning and other foods: Mayonnaise. Premade sauces and marinades. The items listed may not be a complete list. Talk with your dietitian about what dietary choices are right for you. Summary  The Mediterranean diet includes both food and lifestyle choices.  Eat a variety of fresh fruits and vegetables, beans, nuts, seeds, and whole grains.  Limit the amount of red meat and sweets that you eat.  Talk with your health care provider about whether it is safe for you to drink red wine in moderation. This means 1 glass a day for nonpregnant women and 2 glasses a day for men. A glass of wine equals 5 oz (150 mL). This information is not intended to replace advice given to you by your health care  provider. Make sure you discuss any questions you have with your health care provider. Document Revised: 12/20/2015 Document Reviewed: 12/13/2015 Elsevier Patient Education  2020 [place]. vely so that it does not lead to an anxious response. Talk with your health care provider or a counselor to learn more about reducing anxiety and stress. He or she may suggest tension reduction techniques, such as:  Music therapy. This can include creating or listening to music that you enjoy and that inspires you.  Mindfulness-based meditation. This involves being aware of your normal breaths while not trying to control your breathing. It can be done while sitting or walking.  Centering prayer. This involves  focusing on a word, phrase, or sacred image that means something to you and brings you peace.  Deep breathing. To do this, expand your stomach and inhale slowly through your nose. Hold your breath for 3-5 seconds. Then exhale slowly, letting your stomach muscles relax.  Self-talk. This involves identifying thought patterns that lead to anxiety reactions and changing those patterns.  Muscle relaxation. This involves tensing muscles and then relaxing them. Choose a tension reduction technique that suits your lifestyle and personality. These techniques take time and practice. Set aside 5-15 minutes a day to do them. Therapists can offer counseling and training in these techniques. The training to help with anxiety may be covered by some insurance plans. Other things you can do to manage stress and anxiety include:  Keeping a stress/anxiety diary. This can help you learn what triggers your reaction and then learn ways to manage your response.  Thinking about how you react to certain situations. You may not be able to control everything, but you can control your response.  Making time for activities that help you relax and not feeling guilty about spending your time in this way.  Visual imagery and yoga can help you stay calm and relax.   Medicines Medicines can help ease symptoms. Medicines for anxiety include:  Anti-anxiety drugs.  Antidepressants. Medicines are often used as a primary treatment for anxiety disorder. Medicines will be prescribed by a health care provider. When used together, medicines, psychotherapy, and tension reduction techniques may be the most effective treatment. Relationships Relationships can play a big part in helping you recover. Try to spend more time connecting with trusted friends and family members. Consider going to couples counseling, taking family education classes, or going to family therapy. Therapy can help you and others better understand your  condition. How to recognize changes in your anxiety Everyone responds differently to treatment for anxiety. Recovery from anxiety happens when symptoms decrease and stop interfering with your daily activities at home or work. This may mean that you will start to:  Have better concentration and focus. Worry will interfere less in your daily thinking.  Sleep better.  Be less irritable.  Have more energy.  Have improved memory. It is important to recognize when your condition is getting worse. Contact your health care provider if your symptoms interfere with home or work and you feel like your condition is not improving. Follow these instructions at home: Activity  Exercise. Most adults should do the following: ? Exercise for at least 150 minutes each week. The exercise should increase your heart rate and make you sweat (moderate-intensity exercise). ? Strengthening exercises at least twice a week.  Get the right amount and quality of sleep. Most adults need 7-9 hours of sleep each night. Lifestyle  Eat a healthy diet that includes plenty of vegetables, fruits,  whole grains, low-fat dairy products, and lean protein. Do not eat a lot of foods that are high in solid fats, added sugars, or salt.  Make choices that simplify your life.  Do not use any products that contain nicotine or tobacco, such as cigarettes, e-cigarettes, and chewing tobacco. If you need help quitting, ask your health care provider.  Avoid caffeine, alcohol, and certain over-the-counter cold medicines. These may make you feel worse. Ask your pharmacist which medicines to avoid.   General instructions  Take over-the-counter and prescription medicines only as told by your health care provider.  Keep all follow-up visits as told by your health care provider. This is important. Where to find support You can get help and support from these sources:  Self-help groups.  Online and [employer].  A trusted  spiritual leader.  Couples counseling.  Family education classes.  Family therapy. Where to find more information You may find that joining a support group helps you deal with your anxiety. The following sources can help you locate counselors or support groups near you:  [place]: www.mentalhealthamerica.net  Anxiety and Depression Association of [place] (ADAA): https://www.clark.net/  National Alliance on Mental Illness (NAMI): www.nami.org Contact a health care provider if you:  Have a hard time staying focused or finishing daily tasks.  Spend many hours a day feeling worried about everyday life.  Become exhausted by worry.  Start to have headaches, feel tense, or have nausea.  Urinate more than normal.  Have diarrhea. Get help right away if you have:  A racing heart and shortness of breath.  Thoughts of hurting yourself or others. If you ever feel like you may hurt yourself or others, or have thoughts about taking your own life, get help right away. You can go to your nearest emergency department or call:  Your local emergency services (911 in the [place]  A suicide crisis helpline, such as the [facility] at 639-479-8563. This is open 24 hours a day. Summary  Taking steps to learn and use tension reduction techniques can help calm you and help prevent triggering an anxiety reaction.  When used together, medicines, psychotherapy, and tension reduction techniques may be the most effective treatment.  Family, friends, and partners can play a big part in helping you recover from an anxiety disorder. This information is not intended to replace advice given to you by your health care provider. Make sure you discuss any questions you have with your health care provider. Document Revised: 09/21/2018 Document Reviewed: 09/21/2018 Elsevier Patient Education  [place].

## 2020-07-18 NOTE — Progress Notes (Signed)
New Patient Office Visit  Subjective:  Patient ID: Gina Lawrence, female    DOB: 1965-12-10  Age: 55 y.o. MRN: [id number]  CC:  Chief Complaint  Patient presents with  . New Patient (Initial Visit)    HPI Gina Lawrence presents to establish care.   She recently had some lab work at work that showed elevated cholesterol: LDL 158, triglycerides 245, and total cholesterol of 247. HDL was 47.  Her HP was 146/84. Her AC was 5.7.  She reports that her diet is well balanced. She cooks most of her meals herself and doesn't use processed foods often. 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   She has a history of GERD. She has tried prilosec and tums without improvement. She does not eat a lot of fried, greasy, or fatty foods but she still reports heartburn despite her diet.   She reports anxiety following the passing of her mother.   GAD 7 : Generalized Anxiety Score 07/18/2020  Nervous, Anxious, on Edge 3  Control/stop worrying 1  Worry too much - different things 1  Trouble relaxing 3  Restless 3  Easily annoyed or irritable 1  Afraid - awful might happen 0  Total GAD 7 Score 12  Anxiety Difficulty Somewhat difficult    Depression screen [facility] 2/9 07/18/2020 06/16/2016 11/22/2015  Decreased Interest 0 0 2  Down, Depressed, Hopeless 2 0 1  PHQ - 2 Score 2 0 3  Altered sleeping 0 - 3  Tired, decreased energy 1 - 2  Change in appetite 0 - 0  Feeling bad or failure about yourself  0 - 1  Trouble concentrating 2 - 2  Moving slowly or fidgety/restless 1 - 0  Suicidal thoughts - - 0  PHQ-9 Score 6 - 11  Difficult doing work/chores Somewhat difficult - Somewhat difficult     Past Medical History:  Diagnosis Date  . Arthritis   . Depression   . Frequency of urination   . GERD (gastroesophageal reflux disease)   . Hepatitis    as a child  . Hyperlipidemia   . Numbness and tingling     bilateral arms    Past Surgical History:  Procedure Laterality Date  . ANTERIOR CERVICAL DECOMP/DISCECTOMY FUSION N/A 02/14/2014   Procedure: ANTERIOR CERVICAL DECOMPRESSION/DISCECTOMY FUSION 3 LEVELS;  Surgeon: Consuella Lose, MD;  Location: [facility];  Service: Neurosurgery;  Laterality: N/A;  C45 C56 C67 anterior cervical decompression with fusion interbody prosthesis and plating  . SHOULDER SURGERY Right   . TUBAL LIGATION      Family History  Problem Relation Age of Onset  . Diabetes Father   . Arthritis Mother   . Asthma Mother   . Hearing loss Mother     Social History   Socioeconomic History  . Marital status: Married    Spouse name: [name]  . Number of children: 2  . Years of education: 27  . Highest education level: Associate degree: academic program  Occupational History  . Occupation: Public affairs consultant  Tobacco Use  . Smoking status: Never Smoker  . Smokeless tobacco: Never Used  Vaping Use  . Vaping Use: Never used  Substance and Sexual Activity  . Alcohol use: Yes    Alcohol/week: 1.0 standard drink    Types: 1 Standard drinks or equivalent per week    Comment: once or twice weekly  . Drug  use: No  . Sexual activity: Yes    Partners: Male    Birth control/protection: Surgical  Other Topics Concern  . Not on file  Social History Narrative  . Not on file   Social Determinants of Health   Financial Resource Strain: Not on file  Food Insecurity: Not on file  Transportation Needs: Not on file  Physical Activity: Not on file  Stress: Not on file  Social Connections: Not on file  Intimate Partner Violence: Not on file    ROS Review of Systems Negative unless specially indicated above in HPI.  Objective:   Today's Vitals: BP 121/77   Pulse 62   Temp 98.1 F (36.7 C) (Temporal)   Ht $R'5\' 6"'bj$  (1.676 m)   Wt 195 lb 2 oz (88.5 kg)   BMI 31.49 kg/m   Physical Exam Vitals and nursing note reviewed.  Constitutional:      General: She is  not in acute distress.    Appearance: Normal appearance. She is not ill-appearing.  HENT:     Head: Normocephalic and atraumatic.     Right Ear: Tympanic membrane, ear canal and external ear normal.     Left Ear: Ear canal and external ear normal.     Nose: Nose normal.     Mouth/Throat:     Mouth: Mucous membranes are moist.     Pharynx: Oropharynx is clear.  Eyes:     Extraocular Movements: Extraocular movements intact.     Pupils: Pupils are equal, round, and reactive to light.  Cardiovascular:     Rate and Rhythm: Normal rate and regular rhythm.     Pulses: Normal pulses.     Heart sounds: Normal heart sounds. No murmur heard.   Pulmonary:     Effort: Pulmonary effort is normal. No respiratory distress.     Breath sounds: Normal breath sounds.  Abdominal:     General: Bowel sounds are normal. There is no distension.     Palpations: Abdomen is soft. There is no mass.     Tenderness: There is no abdominal tenderness. There is no guarding or rebound.  Musculoskeletal:     Cervical back: Neck supple. No tenderness.     Right lower leg: No edema.     Left lower leg: No edema.  Lymphadenopathy:     Cervical: No cervical adenopathy.  Skin:    General: Skin is warm and dry.  Neurological:     General: No focal deficit present.     Mental Status: She is alert and oriented to person, place, and time.  Psychiatric:        Mood and Affect: Mood normal.        Behavior: Behavior normal.     Assessment & Plan:   Venezia was seen today for new patient (initial visit).  Diagnoses and all orders for this visit:  Elevated BP without diagnosis of hypertension BP 121/77 today. Labs pending as below. Low salt diet, exercise. Follow up in 4 weeks.  -     CMP14+EGFR -     CBC with Differential/Platelet -     Lipid panel -     Thyroid Panel With TSH  Mixed hyperlipidemia Diet and exercise. Labs pending as below. May need to consider statin therapy.  -     Lipid  panel  Prediabetes Diet and exercise. Labs pending as below.  -     CMP14+EGFR -     CBC with Differential/Platelet  BMI 31.0-31.9,adult Diet and  exercise. Labs pending as below.  -     CMP14+EGFR -     CBC with Differential/Platelet -     Lipid panel  Generalized anxiety disorder GAD 7 score of 12 today. Start Celexa. Handout given. Follow up in 4 weeks.   -     citalopram (CELEXA) 20 MG tablet; Take 1 tablet (20 mg total) by mouth daily.  Depression, major, single episode, mild (HCC) PHQ 9 score of 6. Start Celexa as below. Follow up 4 weeks.  -     citalopram (CELEXA) 20 MG tablet; Take 1 tablet (20 mg total) by mouth daily.  Gastroesophageal reflux disease without esophagitis Uncontrolled. No red flags. Start Protonix. Follow up in 4 weeks.  -     pantoprazole (PROTONIX) 20 MG tablet; Take 1 tablet (20 mg total) by mouth daily.  Need for hepatitis C screening test -     Hepatitis C antibody  Encounter for screening mammogram for malignant neoplasm of breast -     MM Digital Screening; Future  Encounter to establish care   Follow-up: Return in about 4 weeks (around 08/15/2020) for chronic follow up.   The patient indicates understanding of these issues and agrees with the plan.   Gwenlyn Perking, FNP

## 2020-07-18 NOTE — Addendum Note (Signed)
Addended by: Gwenlyn Perking on: 07/18/2020 09:15 AM   Modules accepted: Orders

## 2020-07-19 LAB — CMP14+EGFR
ALT: 31 IU/L (ref 0–32)
AST: 22 IU/L (ref 0–40)
Albumin/Globulin Ratio: 2.2 (ref 1.2–2.2)
Albumin: 4.7 g/dL (ref 3.8–4.9)
Alkaline Phosphatase: 97 IU/L (ref 44–121)
BUN/Creatinine Ratio: 23 (ref 9–23)
Bilirubin Total: 0.3 mg/dL (ref 0.0–1.2)
Creatinine, Ser: 0.75 mg/dL (ref 0.57–1.00)
Globulin, Total: 2.1 g/dL (ref 1.5–4.5)
Glucose: 86 mg/dL (ref 65–99)
Potassium: 4.7 mmol/L (ref 3.5–5.2)
Sodium: 143 mmol/L (ref 134–144)
Total Protein: 6.8 g/dL (ref 6.0–8.5)
eGFR: 93 mL/min/{1.73_m2} (ref >59)

## 2020-07-19 LAB — CBC WITH DIFFERENTIAL/PLATELET
Basophils Absolute: 0 10*3/uL (ref 0.0–0.2)
Basos: 1 %
EOS (ABSOLUTE): 0.3 10*3/uL (ref 0.0–0.4)
Eos: 6 %
Immature Grans (Abs): 0 10*3/uL (ref 0.0–0.1)
Lymphocytes Absolute: 2 10*3/uL (ref 0.7–3.1)
Lymphs: 41 %
MCHC: 33.8 g/dL (ref 31.5–35.7)
MCV: 85 fL (ref 79–97)
Monocytes Absolute: 0.4 10*3/uL (ref 0.1–0.9)
Monocytes: 8 %
Neutrophils: 44 %
Platelets: 225 10*3/uL (ref 150–450)
RDW: 12.7 % (ref 11.7–15.4)

## 2020-07-19 LAB — HEPATITIS C ANTIBODY: Hep C Virus Ab: <0.1 s/co ratio (ref 0.0–0.9)

## 2020-07-19 LAB — THYROID PANEL WITH TSH
Free Thyroxine Index: 2.5 (ref 1.2–4.9)
T3 Uptake Ratio: 28 % (ref 24–39)
T4, Total: 8.9 ug/dL (ref 4.5–12.0)

## 2020-07-19 LAB — LIPID PANEL
Cholesterol, Total: 253 mg/dL — ABNORMAL HIGH (ref 100–199)
LDL Chol Calc (NIH): 184 mg/dL — ABNORMAL HIGH (ref 0–99)
VLDL Cholesterol Cal: 24 mg/dL (ref 5–40)

## 2020-08-02 ENCOUNTER — Telehealth: Payer: Self-pay

## 2020-08-02 NOTE — Telephone Encounter (Signed)
Pt wants Gina Lawrence to know she does not like how she feels when she take citalopram (CELEXA) 20 MG tablet. She feels tired out of it and does not feel a wake.  I offered an apt with Gina Lawrence and pt said no she just wanted to send a message. She asked if it is ok to take half tablet or wing off.

## 2020-08-02 NOTE — Telephone Encounter (Signed)
She can take a half tablet. If she would like to taper off completely, take a half tablet x 1 week then discontinue. Have her schedule an appointment if she would like to try another medication.

## 2020-08-02 NOTE — Telephone Encounter (Signed)
Spoke with patient, she is going to try taking half a tablet and see how that makes her feel.  If symptoms do not improve she will discontinue medication.  She has an appointment scheduled on 08/15/20 with Tiffany and will discuss more then.

## 2020-08-15 ENCOUNTER — Encounter: Payer: Self-pay | Admitting: Family Medicine

## 2020-08-15 ENCOUNTER — Other Ambulatory Visit: Payer: Self-pay

## 2020-08-15 ENCOUNTER — Ambulatory Visit (INDEPENDENT_AMBULATORY_CARE_PROVIDER_SITE_OTHER): Payer: Managed Care, Other (non HMO) | Admitting: Family Medicine

## 2020-08-15 VITALS — BP 124/74 | HR 58 | Temp 97.2°F | Ht 66.0 in | Wt 193.8 lb

## 2020-08-15 DIAGNOSIS — K219 Gastro-esophageal reflux disease without esophagitis: Secondary | ICD-10-CM | POA: Diagnosis not present

## 2020-08-15 DIAGNOSIS — E782 Mixed hyperlipidemia: Secondary | ICD-10-CM

## 2020-08-15 DIAGNOSIS — F411 Generalized anxiety disorder: Secondary | ICD-10-CM

## 2020-08-15 NOTE — Patient Instructions (Signed)
Food Choices for Gastroesophageal Reflux Disease, Adult When you have gastroesophageal reflux disease (GERD), the foods you eat and your eating habits are very important. Choosing the right foods can help ease the discomfort of GERD. Consider working with a dietitian to help you make healthy food choices. What are tips for following this plan? Reading food labels  Look for foods that are low in saturated fat. Foods that have less than 5% of daily value (DV) of fat and 0 g of trans fats may help with your symptoms. Cooking  Cook foods using methods other than frying. This may include baking, steaming, grilling, or broiling. These are all methods that do not need a lot of fat for cooking.  To add flavor, try to use herbs that are low in spice and acidity. Meal planning  Choose healthy foods that are low in fat, such as fruits, vegetables, whole grains, low-fat dairy products, lean meats, fish, and poultry.  Eat frequent, small meals instead of three large meals each day. Eat your meals slowly, in a relaxed setting. Avoid bending over or lying down until 2-3 hours after eating.  Limit high-fat foods such as fatty meats or fried foods.  Limit your intake of fatty foods, such as oils, butter, and shortening.  Avoid the following as told by your health care provider: ? Foods that cause symptoms. These may be different for different people. Keep a food diary to keep track of foods that cause symptoms. ? Alcohol. ? Drinking large amounts of liquid with meals. ? Eating meals during the 2-3 hours before bed.   Lifestyle  Maintain a healthy weight. Ask your health care provider what weight is healthy for you. If you need to lose weight, work with your health care provider to do so safely.  Exercise for at least 30 minutes on 5 or more days each week, or as told by your health care provider.  Avoid wearing clothes that fit tightly around your waist and chest.  Do not use any products that  contain nicotine or tobacco. These products include cigarettes, chewing tobacco, and vaping devices, such as e-cigarettes. If you need help quitting, ask your health care provider.  Sleep with the head of your bed raised. Use a wedge under the mattress or blocks under the bed frame to raise the head of the bed.  Chew sugar-free gum after mealtimes. What foods should I eat? Eat a healthy, well-balanced diet of fruits, vegetables, whole grains, low-fat dairy products, lean meats, fish, and poultry. Each person is different. Foods that may trigger symptoms in one person may not trigger any symptoms in another person. Work with your health care provider to identify foods that are safe for you. The items listed above may not be a complete list of recommended foods and beverages. Contact a dietitian for more information.   What foods should I avoid? Limiting some of these foods may help manage the symptoms of GERD. Everyone is different. Consult a dietitian or your health care provider to help you identify the exact foods to avoid, if any. Fruits Any fruits prepared with added fat. Any fruits that cause symptoms. For some people this may include citrus fruits, such as oranges, grapefruit, pineapple, and lemons. Vegetables Deep-fried vegetables. French fries. Any vegetables prepared with added fat. Any vegetables that cause symptoms. For some people, this may include tomatoes and tomato products, chili peppers, onions and garlic, and horseradish. Grains Pastries or quick breads with added fat. Meats and other proteins High-fat   meats, such as fatty beef or pork, hot dogs, ribs, ham, sausage, salami, and bacon. Fried meat or protein, including fried fish and fried chicken. Nuts and nut butters, in large amounts. Dairy Whole milk and chocolate milk. Sour cream. Cream. Ice cream. Cream cheese. Milkshakes. Fats and oils Butter. Margarine. Shortening. Ghee. Beverages Coffee and tea, with or without  caffeine. Carbonated beverages. Sodas. Energy drinks. Fruit juice made with acidic fruits, such as orange or grapefruit. Tomato juice. Alcoholic drinks. Sweets and desserts Chocolate and cocoa. Donuts. Seasonings and condiments Pepper. Peppermint and spearmint. Added salt. Any condiments, herbs, or seasonings that cause symptoms. For some people, this may include curry, hot sauce, or vinegar-based salad dressings. The items listed above may not be a complete list of foods and beverages to avoid. Contact a dietitian for more information. Questions to ask your health care provider Diet and lifestyle changes are usually the first steps that are taken to manage symptoms of GERD. If diet and lifestyle changes do not improve your symptoms, talk with your health care provider about taking medicines. Where to find more information  International Foundation for Gastrointestinal Disorders: aboutgerd.org Summary  When you have gastroesophageal reflux disease (GERD), food and lifestyle choices may be very helpful in easing the discomfort of GERD.  Eat frequent, small meals instead of three large meals each day. Eat your meals slowly, in a relaxed setting. Avoid bending over or lying down until 2-3 hours after eating.  Limit high-fat foods such as fatty meats or fried foods. This information is not intended to replace advice given to you by your health care provider. Make sure you discuss any questions you have with your health care provider. Document Revised: 10/31/2019 Document Reviewed: 10/31/2019 Elsevier Patient Education  [place].

## 2020-08-15 NOTE — Progress Notes (Signed)
Established Patient Office Visit  Subjective:  Patient ID: Gina Lawrence, female    DOB: Oct 15, 1965  Age: 55 y.o. MRN: [id number]  CC:  Chief Complaint  Patient presents with  . Medical Management of Chronic Issues  . Gastroesophageal Reflux    Does she need to stay on protonix   . Anxiety    Patient states she is doing better with her anxiety but is better because BP is not high     HPI Gina Lawrence presents for chronic follow up.   1. GERD Compliant with medications - Yes, but would like to stop taking it Current medications - Protonix Adverse side effects - No Sore throat - No Voice change - No Hemoptysis - No Dysphagia or dyspepsia - No Water brash - No Red Flags (weight loss, hematochezia, melena, weight loss, early satiety, fevers, odynophagia, or persistent vomiting) - No  She has been on Protonix for 1 month and would like to stop taking it if possible. She would like to focus on diet to control symptoms.   2. GAD Reports anxiety has been much better since finding out that she does not have HTN. She has also been getting out and walking some and this has been helpful with her anxiety. She did not tolerate Celexa due to side effect and stopped taking it.   GAD 7 : Generalized Anxiety Score 08/15/2020 07/18/2020  Nervous, Anxious, on Edge 1 3  Control/stop worrying 0 1  Worry too much - different things 0 1  Trouble relaxing 1 3  Restless 0 3  Easily annoyed or irritable 0 1  Afraid - awful might happen 0 0  Total GAD 7 Score 2 12  Anxiety Difficulty - Somewhat difficult      Past Medical History:  Diagnosis Date  . Arthritis   . Depression   . Frequency of urination   . GERD (gastroesophageal reflux disease)   . Hepatitis    as a child  . Hyperlipidemia   . Numbness and tingling    bilateral arms    Past Surgical History:  Procedure Laterality Date  . ANTERIOR CERVICAL DECOMP/DISCECTOMY FUSION N/A 02/14/2014   Procedure: ANTERIOR  CERVICAL DECOMPRESSION/DISCECTOMY FUSION 3 LEVELS;  Surgeon: Consuella Lose, MD;  Location: [facility];  Service: Neurosurgery;  Laterality: N/A;  C45 C56 C67 anterior cervical decompression with fusion interbody prosthesis and plating  . SHOULDER SURGERY Right   . TUBAL LIGATION      Family History  Problem Relation Age of Onset  . Diabetes Father   . Arthritis Mother   . Asthma Mother   . Hearing loss Mother     Social History   Socioeconomic History  . Marital status: Married    Spouse name: [name]  . Number of children: 2  . Years of education: 23  . Highest education level: Associate degree: academic program  Occupational History  . Occupation: Public affairs consultant  Tobacco Use  . Smoking status: Never Smoker  . Smokeless tobacco: Never Used  Vaping Use  . Vaping Use: Never used  Substance and Sexual Activity  . Alcohol use: Yes    Alcohol/week: 1.0 standard drink    Types: 1 Standard drinks or equivalent per week    Comment: once or twice weekly  . Drug use: No  . Sexual activity: Yes    Partners: Male    Birth control/protection: Surgical  Other Topics Concern  . Not on file  Social History Narrative  .  Not on file   Social Determinants of Health   Financial Resource Strain: Not on file  Food Insecurity: Not on file  Transportation Needs: Not on file  Physical Activity: Not on file  Stress: Not on file  Social Connections: Not on file  Intimate Partner Violence: Not on file    Outpatient Medications Prior to Visit  Medication Sig Dispense Refill  . Cholecalciferol (VITAMIN D) 2000 UNITS CAPS Take 1 capsule by mouth daily.     Marland Kitchen HEMP OIL-VANILLYL BUTYL ETHER EX Apply topically. Ointment she uses for arthritis    . pantoprazole (PROTONIX) 20 MG tablet Take 1 tablet (20 mg total) by mouth daily. 30 tablet 3  . citalopram (CELEXA) 20 MG tablet Take 1 tablet (20 mg total) by mouth daily. (Patient not taking: Reported on 08/15/2020) 30 tablet 5   No  facility-administered medications prior to visit.    Allergies  Allergen Reactions  . Penicillins Hives  . Morphine And Related Nausea And Vomiting  . Nasal Spray Other (See Comments)    ROS Review of Systems As per HPI.   Objective:    Physical Exam Vitals and nursing note reviewed.  Constitutional:      General: She is not in acute distress.    Appearance: Normal appearance. She is not ill-appearing, toxic-appearing or diaphoretic.  Cardiovascular:     Rate and Rhythm: Normal rate and regular rhythm.     Heart sounds: Normal heart sounds. No murmur heard.   Pulmonary:     Effort: Pulmonary effort is normal. No respiratory distress.     Breath sounds: Normal breath sounds.  Abdominal:     General: Bowel sounds are normal. There is no distension.     Palpations: Abdomen is soft.     Tenderness: There is no abdominal tenderness. There is no guarding or rebound.  Musculoskeletal:     Right lower leg: No edema.     Left lower leg: No edema.  Skin:    General: Skin is warm and dry.  Neurological:     General: No focal deficit present.     Mental Status: She is alert and oriented to person, place, and time.  Psychiatric:        Mood and Affect: Mood normal.        Behavior: Behavior normal.     BP 124/74   Pulse (!) 58   Temp (!) 97.2 F (36.2 C) (Temporal)   Ht 5\' 6"  (1.676 m)   Wt 193 lb 12.8 oz (87.9 kg)   BMI 31.28 kg/m  Wt Readings from Last 3 Encounters:  08/15/20 193 lb 12.8 oz (87.9 kg)  07/18/20 195 lb 2 oz (88.5 kg)  06/16/16 171 lb (77.6 kg)     Health Maintenance Due  Topic Date Due  . MAMMOGRAM  09/25/2017    There are no preventive care reminders to display for this patient.  Lab Results  Component Value Date   TSH 1.410 07/18/2020   Lab Results  Component Value Date   WBC 4.9 07/18/2020   HGB 15.5 07/18/2020   HCT 45.8 07/18/2020   MCV 85 07/18/2020   PLT 225 07/18/2020   Lab Results  Component Value Date   NA 143 07/18/2020    K 4.7 07/18/2020   CO2 24 07/18/2020   GLUCOSE 86 07/18/2020   BUN 17 07/18/2020   CREATININE 0.75 07/18/2020   BILITOT 0.3 07/18/2020   ALKPHOS 97 07/18/2020   AST 22 07/18/2020  ALT 31 07/18/2020   PROT 6.8 07/18/2020   ALBUMIN 4.7 07/18/2020   CALCIUM 9.7 07/18/2020   ANIONGAP 12 02/09/2014   Lab Results  Component Value Date   CHOL 253 (H) 07/18/2020   Lab Results  Component Value Date   HDL 45 07/18/2020   Lab Results  Component Value Date   LDLCALC 184 (H) 07/18/2020   Lab Results  Component Value Date   TRIG 130 07/18/2020   Lab Results  Component Value Date   CHOLHDL 5.6 (H) 07/18/2020   No results found for: HGBA1C    Assessment & Plan:   Jaely was seen today for medical management of chronic issues, gastroesophageal reflux and anxiety.  Diagnoses and all orders for this visit:  Generalized anxiety disorder Well controlled without medication.   Gastroesophageal reflux disease without esophagitis On protonix x 1 month and patient would like to discontinue this. Discussed taking OTC pepcid for a few days and discontinuing protonix. Handout given with diet recommendation for GERD. Follow up if symptoms return when stopping protonix.   Mixed Hyperlipemia Diet and exercise.   Follow-up: Return in about 6 months (around 02/14/2021) for chronic follow up.   The patient indicates understanding of these issues and agrees with the plan.    Gwenlyn Perking, FNP

## 2020-09-26 ENCOUNTER — Other Ambulatory Visit: Payer: Self-pay

## 2020-09-26 ENCOUNTER — Ambulatory Visit
Admission: RE | Admit: 2020-09-26 | Discharge: 2020-09-26 | Disposition: A | Discharge status: Home / Self Care | Payer: Managed Care, Other (non HMO) | Source: Ambulatory Visit | Attending: Family Medicine | Admitting: Family Medicine

## 2020-09-26 DIAGNOSIS — Z1231 Encounter for screening mammogram for malignant neoplasm of breast: Secondary | ICD-10-CM

## 2021-02-14 ENCOUNTER — Ambulatory Visit: Payer: Managed Care, Other (non HMO) | Admitting: Family Medicine

## 2021-03-25 ENCOUNTER — Encounter: Payer: Self-pay | Admitting: Family Medicine

## 2021-03-25 ENCOUNTER — Other Ambulatory Visit: Payer: Self-pay

## 2021-03-25 ENCOUNTER — Other Ambulatory Visit (HOSPITAL_COMMUNITY)
Admission: RE | Admit: 2021-03-25 | Discharge: 2021-03-25 | Disposition: A | Discharge status: Home / Self Care | Payer: Managed Care, Other (non HMO) | Source: Ambulatory Visit | Attending: Family Medicine | Admitting: Family Medicine

## 2021-03-25 ENCOUNTER — Ambulatory Visit (INDEPENDENT_AMBULATORY_CARE_PROVIDER_SITE_OTHER): Payer: Managed Care, Other (non HMO) | Admitting: Family Medicine

## 2021-03-25 VITALS — BP 130/89 | HR 72 | Temp 97.7°F | Resp 20 | Ht 66.0 in | Wt 192.0 lb

## 2021-03-25 DIAGNOSIS — Z0000 Encounter for general adult medical examination without abnormal findings: Secondary | ICD-10-CM | POA: Diagnosis present

## 2021-03-25 DIAGNOSIS — E782 Mixed hyperlipidemia: Secondary | ICD-10-CM | POA: Diagnosis not present

## 2021-03-25 DIAGNOSIS — Z124 Encounter for screening for malignant neoplasm of cervix: Secondary | ICD-10-CM | POA: Diagnosis present

## 2021-03-25 DIAGNOSIS — Z0001 Encounter for general adult medical examination with abnormal findings: Secondary | ICD-10-CM | POA: Diagnosis not present

## 2021-03-25 NOTE — Progress Notes (Signed)
Gina Lawrence is a 55 y.o. female presents to office today for annual physical exam examination.    Concerns today include: 1. None  Occupation: Curator, Marital status: married, Substance use: denies Diet: regular, Exercise: walks daily Last colonoscopy: 2013, due next year Last mammogram: 09/2020 Last pap smear: 06/13/2015  She has had labs done recently at work. She declines labs today.   Depression screen [facility] 2/9 03/25/2021 07/18/2020 06/16/2016  Decreased Interest 0 0 0  Down, Depressed, Hopeless 0 2 0  PHQ - 2 Score 0 2 0  Altered sleeping 0 0 -  Tired, decreased energy 1 1 -  Change in appetite 1 0 -  Feeling bad or failure about yourself  0 0 -  Trouble concentrating 0 2 -  Moving slowly or fidgety/restless 0 1 -  Suicidal thoughts 0 - -  PHQ-9 Score 2 6 -  Difficult doing work/chores Not difficult at all Somewhat difficult -   GAD 7 : Generalized Anxiety Score 03/25/2021 08/15/2020 07/18/2020  Nervous, Anxious, on Edge 2 1 3   Control/stop worrying 0 0 1  Worry too much - different things 0 0 1  Trouble relaxing 2 1 3   Restless 1 0 3  Easily annoyed or irritable 0 0 1  Afraid - awful might happen 0 0 0  Total GAD 7 Score 5 2 12   Anxiety Difficulty Somewhat difficult - Somewhat difficult    Past Medical History:  Diagnosis Date   Arthritis    Depression    Frequency of urination    GERD (gastroesophageal reflux disease)    Hepatitis    as a child   Hyperlipidemia    Numbness and tingling    bilateral arms   Social History   Socioeconomic History   Marital status: Married    Spouse name: [name]   Number of children: 2   Years of education: 14   Highest education level: Associate degree: academic program  Occupational History   Occupation: Public affairs consultant  Tobacco Use   Smoking status: Never   Smokeless tobacco: Never  Vaping Use   Vaping Use: Never used  Substance and Sexual Activity   Alcohol use: Yes    Alcohol/week: 1.0 standard  drink    Types: 1 Standard drinks or equivalent per week    Comment: once or twice weekly   Drug use: No   Sexual activity: Yes    Partners: Male    Birth control/protection: Surgical  Other Topics Concern   Not on file  Social History Narrative   Not on file   Social Determinants of Health   Financial Resource Strain: Not on file  Food Insecurity: Not on file  Transportation Needs: Not on file  Physical Activity: Not on file  Stress: Not on file  Social Connections: Not on file  Intimate Partner Violence: Not on file   Past Surgical History:  Procedure Laterality Date   ANTERIOR CERVICAL DECOMP/DISCECTOMY FUSION N/A 02/14/2014   Procedure: ANTERIOR CERVICAL DECOMPRESSION/DISCECTOMY FUSION 3 LEVELS;  Surgeon: Consuella Lose, MD;  Location: [facility];  Service: Neurosurgery;  Laterality: N/A;  C45 C56 C67 anterior cervical decompression with fusion interbody prosthesis and plating   SHOULDER SURGERY Right    TUBAL LIGATION     Family History  Problem Relation Age of Onset   Diabetes Father    Arthritis Mother    Asthma Mother    Hearing loss Mother     Current Outpatient Medications:  Cholecalciferol (VITAMIN D) 2000 UNITS CAPS, Take 1 capsule by mouth daily. , Disp: , Rfl:    HEMP OIL-VANILLYL BUTYL ETHER EX, Apply topically. Ointment she uses for arthritis, Disp: , Rfl:    pantoprazole (PROTONIX) 20 MG tablet, Take 1 tablet (20 mg total) by mouth daily. (Patient taking differently: Take 20 mg by mouth daily as needed.), Disp: 30 tablet, Rfl: 3  Allergies  Allergen Reactions   Penicillins Hives   Morphine And Related Nausea And Vomiting   Nasal Spray Other (See Comments)     ROS: Review of Systems Pertinent items noted in HPI and remainder of comprehensive ROS otherwise negative.    Physical exam BP 130/89   Pulse 72   Temp 97.7 F (36.5 C) (Temporal)   Resp 20   Ht 5\' 6"  (1.676 m)   Wt 192 lb (87.1 kg)   SpO2 98%   BMI 30.99 kg/m  General  appearance: alert, cooperative, and no distress Head: Normocephalic, without obvious abnormality, atraumatic Eyes: conjunctivae/corneas clear. PERRL, EOM's intact. Fundi benign. Ears: normal TM's and external ear canals both ears Nose: Nares normal. Septum midline. Mucosa normal. No drainage or sinus tenderness. Throat: lips, mucosa, and tongue normal; teeth and gums normal Neck: no adenopathy, no carotid bruit, no JVD, supple, symmetrical, trachea midline, and thyroid not enlarged, symmetric, no tenderness/mass/nodules Lungs: clear to auscultation bilaterally Heart: regular rate and rhythm, S1, S2 normal, no murmur, click, rub or gallop Abdomen: soft, non-tender; bowel sounds normal; no masses,  no organomegaly Pelvic: cervix normal in appearance, external genitalia normal, no adnexal masses or tenderness, no cervical motion tenderness, rectovaginal septum normal, uterus normal size, shape, and consistency, and vagina normal without discharge Extremities: extremities normal, atraumatic, no cyanosis or edema Skin: Skin color, texture, turgor normal. No rashes or lesions Neurologic: Alert and oriented X 3, normal strength and tone. Normal symmetric reflexes. Normal coordination and gait    Assessment/ Plan: Gina Lawrence here for annual physical exam.   Gina Lawrence was seen today for annual exam.  Diagnoses and all orders for this visit:  Encounter for wellness examination Declined labs today as she recently had this done through work. GYN exam with pap today.  -     Cytology - PAP  Screening for malignant neoplasm of cervix -     Cytology - PAP   Counseled on healthy lifestyle choices, including diet (rich in fruits, vegetables and lean meats and low in salt and simple carbohydrates) and exercise (at least 30 minutes of moderate physical activity daily).  Patient to follow up in 1 year for annual exam or sooner if needed.  The above assessment and management plan was discussed with  the patient. The patient verbalized understanding of and has agreed to the management plan. Patient is aware to call the clinic if symptoms persist or worsen. Patient is aware when to return to the clinic for a follow-up visit. Patient educated on when it is appropriate to go to the emergency department.   Marjorie Smolder, FNP-C [facility] [address] [place], [place] [address] [phone]

## 2021-03-25 NOTE — Patient Instructions (Signed)

## 2021-04-01 LAB — CYTOLOGY - PAP: Diagnosis: NEGATIVE

## 2022-03-26 ENCOUNTER — Encounter: Payer: Self-pay | Admitting: Family Medicine

## 2022-03-26 ENCOUNTER — Ambulatory Visit (INDEPENDENT_AMBULATORY_CARE_PROVIDER_SITE_OTHER): Payer: Managed Care, Other (non HMO) | Admitting: Family Medicine

## 2022-03-26 VITALS — BP 134/79 | HR 71 | Temp 97.8°F | Ht 66.0 in | Wt 197.4 lb

## 2022-03-26 DIAGNOSIS — Z0000 Encounter for general adult medical examination without abnormal findings: Secondary | ICD-10-CM

## 2022-03-26 DIAGNOSIS — Z1211 Encounter for screening for malignant neoplasm of colon: Secondary | ICD-10-CM

## 2022-03-26 NOTE — Patient Instructions (Signed)

## 2022-03-26 NOTE — Progress Notes (Signed)
Complete physical exam  Patient: Gina Lawrence   DOB: [birth year]   56 y.o. Female  MRN: [id number]  Subjective:    Chief Complaint  Patient presents with   Annual Exam    Gina Lawrence is a 56 y.o. female who presents today for a complete physical exam. She reports consuming a well balanced diet. Home exercise routine includes walking 4 hrs per week. She generally feels well. She reports sleeping well. She does have additional problems to discuss today.   Most recent fall risk assessment:    03/26/2022   10:55 AM  Fall Risk   Falls in the past year? 0     Most recent depression screenings:    03/26/2022   10:55 AM 03/25/2021   10:50 AM  PHQ 2/9 Scores  PHQ - 2 Score 0 0  PHQ- 9 Score 0 2      03/26/2022   10:55 AM 03/25/2021   10:50 AM 07/18/2020    8:06 AM  Depression screen PHQ 2/9  Decreased Interest 0 0 0  Down, Depressed, Hopeless 0 0 2  PHQ - 2 Score 0 0 2  Altered sleeping 0 0 0  Tired, decreased energy 0 1 1  Change in appetite 0 1 0  Feeling bad or failure about yourself  0 0 0  Trouble concentrating 0 0 2  Moving slowly or fidgety/restless 0 0 1  Suicidal thoughts 0 0   PHQ-9 Score 0 2 6  Difficult doing work/chores  Not difficult at all Somewhat difficult    Vision:Within last year Dental: regular dental care  Past Medical History:  Diagnosis Date   Arthritis    Depression    Frequency of urination    GERD (gastroesophageal reflux disease)    Hepatitis    as a child   Hyperlipidemia    Numbness and tingling    bilateral arms      Patient Care Team: Gwenlyn Perking, FNP as PCP - General (Family Medicine)   Outpatient Medications Prior to Visit  Medication Sig   Cholecalciferol (VITAMIN D) 2000 UNITS CAPS Take 1 capsule by mouth daily.    HEMP OIL-VANILLYL BUTYL ETHER EX Apply topically. Ointment she uses for arthritis   pantoprazole (PROTONIX) 20 MG tablet Take 1 tablet (20 mg total) by mouth daily. (Patient taking  differently: Take 20 mg by mouth daily as needed.)   No facility-administered medications prior to visit.    ROS Negative unless specially indicated above in HPI.    Objective:     BP 134/79   Pulse 71   Temp 97.8 F (36.6 C)   Ht '5\' 6"'$  (1.676 m)   Wt 197 lb 6.4 oz (89.5 kg)   SpO2 97%   BMI 31.86 kg/m  Wt Readings from Last 3 Encounters:  03/26/22 197 lb 6.4 oz (89.5 kg)  03/25/21 192 lb (87.1 kg)  08/15/20 193 lb 12.8 oz (87.9 kg)     Physical Exam Vitals and nursing note reviewed.  Constitutional:      General: She is not in acute distress.    Appearance: Normal appearance. She is not ill-appearing, toxic-appearing or diaphoretic.  HENT:     Head: Normocephalic and atraumatic.     Right Ear: Tympanic membrane, ear canal and external ear normal.     Left Ear: Tympanic membrane, ear canal and external ear normal.     Nose: Nose normal.     Mouth/Throat:     Mouth: Mucous  membranes are moist.     Pharynx: Oropharynx is clear.  Eyes:     Extraocular Movements: Extraocular movements intact.     Conjunctiva/sclera: Conjunctivae normal.     Pupils: Pupils are equal, round, and reactive to light.  Neck:     Thyroid: No thyroid mass, thyromegaly or thyroid tenderness.  Cardiovascular:     Rate and Rhythm: Normal rate and regular rhythm.     Pulses: Normal pulses.     Heart sounds: Normal heart sounds. No murmur heard.    No friction rub. No gallop.  Pulmonary:     Effort: Pulmonary effort is normal.     Breath sounds: Normal breath sounds.  Abdominal:     General: Bowel sounds are normal. There is no distension.     Palpations: Abdomen is soft. There is no mass.     Tenderness: There is no abdominal tenderness. There is no guarding.  Musculoskeletal:        General: No swelling or tenderness.     Cervical back: Normal range of motion and neck supple. No tenderness.     Right lower leg: No edema.     Left lower leg: No edema.  Skin:    General: Skin is warm and  dry.     Capillary Refill: Capillary refill takes less than 2 seconds.     Findings: No lesion or rash.  Neurological:     General: No focal deficit present.     Mental Status: She is alert and oriented to person, place, and time.     Cranial Nerves: No cranial nerve deficit.     Motor: No weakness.     Gait: Gait normal.  Psychiatric:        Mood and Affect: Mood normal.        Behavior: Behavior normal.        Thought Content: Thought content normal.        Judgment: Judgment normal.      No results found for any visits on 03/26/22.     Assessment & Plan:    Routine Health Maintenance and Physical Exam Vallerie was seen today for annual exam.  Diagnoses and all orders for this visit:  Routine general medical examination at a health care facility  Colon cancer screening -     Ambulatory referral to Gastroenterology  There is no immunization history on file for this patient.  Health Maintenance  Topic Date Due   COLONOSCOPY (Pts 45-21yr Insurance coverage will need to be confirmed)  06/12/2021   COVID-19 Vaccine (1) 04/11/2022 (Originally 08/30/1966)   Zoster Vaccines- Shingrix (1 of 2) 06/26/2022 (Originally 03/01/2014)   INFLUENZA VACCINE  08/03/2022 (Originally 12/03/2021)   HIV Screening  03/27/2023 (Originally 03/02/1979)   MAMMOGRAM  09/27/2022   PAP SMEAR-Modifier  03/25/2024   Hepatitis C Screening  Completed   HPV VACCINES  Aged Out    Discussed health benefits of physical activity, and encouraged her to engage in regular exercise appropriate for her age and condition.  Problem List Items Addressed This Visit   None Visit Diagnoses     Routine general medical examination at a health care facility    -  Primary   Colon cancer screening       Relevant Orders   Ambulatory referral to Gastroenterology      Return in 1 year (on 03/27/2023) for CPE.   The patient indicates understanding of these issues and agrees with the plan.  TGwenlyn Perking  FNP

## 2022-03-31 ENCOUNTER — Encounter (INDEPENDENT_AMBULATORY_CARE_PROVIDER_SITE_OTHER): Payer: Self-pay | Admitting: *Deleted

## 2022-04-15 ENCOUNTER — Telehealth: Payer: Self-pay | Admitting: *Deleted

## 2022-04-15 NOTE — Telephone Encounter (Signed)
Referring MD/PCP: Marjorie Smolder  Procedure: Colonoscopy  Reason/Indication:  screening   Has patient had this procedure before?  2013  If so, when, by whom and where?    Is there a family history of colon cancer?  no  Who?  What age when diagnosed?    Is patient diabetic? If yes, Type 1 or Type 2   no      Does patient have prosthetic heart valve or mechanical valve?  no  Do you have a pacemaker/defibrillator?  no  Has patient ever had endocarditis/atrial fibrillation? no  Does patient use oxygen? no  Has patient had joint replacement within last 12 months?  no  Is patient constipated or do they take laxatives? no  Does patient have a history of alcohol/drug use?  no  Have you had a stroke/heart attack last 6 mths? no  Do you take medicine for weight loss?  no  For female patients,: have you had a hysterectomy no                      are you post menopausal yes                      do you still have your menstrual cycle no  Is patient on blood thinner such as Coumadin, Plavix and/or Aspirin? no  Medications:  No current outpatient medications on file prior to visit.   No current facility-administered medications on file prior to visit.     Allergies:  Allergies  Allergen Reactions   Penicillins Hives   Morphine And Related Nausea And Vomiting   Nasal Spray Other (See Comments)     [employer] [place]

## 2022-04-15 NOTE — Telephone Encounter (Signed)
Any room

## 2022-04-16 NOTE — Telephone Encounter (Signed)
LMOVM to call back 

## 2022-04-17 NOTE — Telephone Encounter (Signed)
Letter also mailed

## 2022-05-07 MED ORDER — NA SULFATE-K SULFATE-MG SULF 17.5-3.13-1.6 GM/177ML PO SOLN: ORAL | 0 refills | Status: DC

## 2022-05-07 NOTE — Addendum Note (Signed)
Addended by: Cheron Every on: 05/07/2022 10:46 AM   Modules accepted: Orders

## 2022-05-07 NOTE — Telephone Encounter (Signed)
PT CALLED IN. Scheduled for 1/31 at 10:15am. Aware will send instructions. Rx for prep sent to pharmacy.

## 2022-05-13 NOTE — Telephone Encounter (Signed)
Referral completed

## 2022-06-04 ENCOUNTER — Ambulatory Visit (HOSPITAL_COMMUNITY)
Admission: RE | Admit: 2022-06-04 | Discharge: 2022-06-04 | Disposition: A | Discharge status: Home / Self Care | Payer: Managed Care, Other (non HMO) | Attending: Gastroenterology | Admitting: Gastroenterology

## 2022-06-04 ENCOUNTER — Ambulatory Visit (HOSPITAL_BASED_OUTPATIENT_CLINIC_OR_DEPARTMENT_OTHER): Payer: Managed Care, Other (non HMO) | Admitting: Anesthesiology

## 2022-06-04 ENCOUNTER — Ambulatory Visit (HOSPITAL_COMMUNITY): Payer: Managed Care, Other (non HMO) | Admitting: Anesthesiology

## 2022-06-04 ENCOUNTER — Other Ambulatory Visit: Payer: Self-pay

## 2022-06-04 ENCOUNTER — Encounter (HOSPITAL_COMMUNITY): Payer: Self-pay | Admitting: Gastroenterology

## 2022-06-04 ENCOUNTER — Encounter (HOSPITAL_COMMUNITY)
Admission: RE | Disposition: A | Discharge status: Home / Self Care | Payer: Self-pay | Source: Home / Self Care | Attending: Gastroenterology

## 2022-06-04 DIAGNOSIS — E785 Hyperlipidemia, unspecified: Secondary | ICD-10-CM | POA: Diagnosis not present

## 2022-06-04 DIAGNOSIS — Z1211 Encounter for screening for malignant neoplasm of colon: Secondary | ICD-10-CM

## 2022-06-04 DIAGNOSIS — Z86010 Personal history of colonic polyps: Secondary | ICD-10-CM | POA: Insufficient documentation

## 2022-06-04 DIAGNOSIS — K219 Gastro-esophageal reflux disease without esophagitis: Secondary | ICD-10-CM | POA: Insufficient documentation

## 2022-06-04 DIAGNOSIS — K635 Polyp of colon: Secondary | ICD-10-CM

## 2022-06-04 DIAGNOSIS — M1990 Unspecified osteoarthritis, unspecified site: Secondary | ICD-10-CM | POA: Insufficient documentation

## 2022-06-04 DIAGNOSIS — F32A Depression, unspecified: Secondary | ICD-10-CM | POA: Insufficient documentation

## 2022-06-04 DIAGNOSIS — D120 Benign neoplasm of cecum: Secondary | ICD-10-CM | POA: Insufficient documentation

## 2022-06-04 DIAGNOSIS — K648 Other hemorrhoids: Secondary | ICD-10-CM | POA: Insufficient documentation

## 2022-06-04 HISTORY — PX: COLONOSCOPY WITH PROPOFOL: SHX5780

## 2022-06-04 HISTORY — PX: POLYPECTOMY: SHX149

## 2022-06-04 LAB — HM COLONOSCOPY

## 2022-06-04 SURGERY — COLONOSCOPY WITH PROPOFOL
Anesthesia: General

## 2022-06-04 MED ORDER — LACTATED RINGERS IV SOLN: INTRAVENOUS | Status: DC

## 2022-06-04 MED ORDER — PROPOFOL 500 MG/50ML IV EMUL
INTRAVENOUS | Status: DC | PRN
  Administered 2022-06-04: 150 ug/kg/min via INTRAVENOUS

## 2022-06-04 MED ORDER — PROPOFOL 10 MG/ML IV BOLUS
INTRAVENOUS | Status: DC | PRN
  Administered 2022-06-04: 60 mg via INTRAVENOUS

## 2022-06-04 MED ORDER — PROPOFOL 500 MG/50ML IV EMUL
INTRAVENOUS | Status: AC
  Filled 2022-06-04: qty 50

## 2022-06-04 MED ORDER — STERILE WATER FOR IRRIGATION IR SOLN
Status: DC | PRN
  Administered 2022-06-04: 60 mL

## 2022-06-04 NOTE — H&P (Signed)
Gina Lawrence is an 57 y.o. female.   Chief Complaint: history colon polyps HPI: 56 y/o F with PM<H depression, arthritis, GERD, HLD, coming for history of colonic polyps.  Last colonoscopy was performed possibly 5 years ago, no polyps were found at that time but she has had polyps x 2 in the past.. The patient has never had a colonoscopy in the past.  The patient denies having any complaints such as melena, hematochezia, abdominal pain or distention, change in her bowel movement consistency or frequency, no changes in weight recently.  No family history of colorectal cancer.   Past Medical History:  Diagnosis Date   Arthritis    Depression    Frequency of urination    GERD (gastroesophageal reflux disease)    Hepatitis    as a child   Hyperlipidemia    Numbness and tingling    bilateral arms    Past Surgical History:  Procedure Laterality Date   ANTERIOR CERVICAL DECOMP/DISCECTOMY FUSION N/A 02/14/2014   Procedure: ANTERIOR CERVICAL DECOMPRESSION/DISCECTOMY FUSION 3 LEVELS;  Surgeon: Consuella Lose, MD;  Location: [facility];  Service: Neurosurgery;  Laterality: N/A;  C45 C56 C67 anterior cervical decompression with fusion interbody prosthesis and plating   SHOULDER SURGERY Right    TUBAL LIGATION      Family History  Problem Relation Age of Onset   Diabetes Father    Arthritis Mother    Asthma Mother    Hearing loss Mother    Social History:  reports that she has never smoked. She has never used smokeless tobacco. She reports current alcohol use of about 1.0 standard drink of alcohol per week. She reports that she does not use drugs.  Allergies:  Allergies  Allergen Reactions   Penicillins Hives   Morphine And Related Nausea And Vomiting   Nasal Spray Other (See Comments)    All nasal sprays close nasal passages and cause severe sneezing     Medications Prior to Admission  Medication Sig Dispense Refill   aspirin-acetaminophen-caffeine (EXCEDRIN EXTRA  STRENGTH) 250-250-65 MG tablet Take 2 tablets by mouth every 6 (six) hours as needed for headache.     Cholecalciferol (VITAMIN D-3) 125 MCG (5000 UT) TABS Take 15,000 Units by mouth daily.     esomeprazole (NEXIUM) 20 MG capsule Take 20 mg by mouth daily at 12 noon.     ibuprofen (ADVIL) 200 MG tablet Take 200 mg by mouth every 6 (six) hours as needed for moderate pain.     Na Sulfate-K Sulfate-Mg Sulf 17.5-3.13-1.6 GM/177ML SOLN As directed 354 mL 0   OVER THE COUNTER MEDICATION Place 1 Dose under the tongue daily. CBD oil (1 dose = 1 dropper full)     VITAMIN E PO Take 1 capsule by mouth 2 (two) times a week.      No results found for this or any previous visit (from the past 48 hour(s)). No results found.  Review of Systems  All other systems reviewed and are negative.   Blood pressure 133/82, pulse 82, temperature 98.4 F (36.9 C), temperature source Oral, resp. rate 10, height '5\' 6"'$  (1.676 m), weight 87.5 kg, SpO2 97 %. Physical Exam  GENERAL: The patient is AO x3, in no acute distress. HEENT: Head is normocephalic and atraumatic. EOMI are intact. Mouth is well hydrated and without lesions. NECK: Supple. No masses LUNGS: Clear to auscultation. No presence of rhonchi/wheezing/rales. Adequate chest expansion HEART: RRR, normal s1 and s2. ABDOMEN: Soft, nontender, no guarding, no  peritoneal signs, and nondistended. BS +. No masses. EXTREMITIES: Without any cyanosis, clubbing, rash, lesions or edema. NEUROLOGIC: AOx3, no focal motor deficit. SKIN: no jaundice, no rashes   Assessment/Plan  57 y/o F with PM<H depression, arthritis, GERD, HLD, coming for history of colonic polyps.  Will proceed with colonoscopy.  Gina Quale, MD 06/04/2022, 7:27 AM

## 2022-06-04 NOTE — Anesthesia Procedure Notes (Signed)
Date/Time: 06/04/2022 7:33 AM  Performed by: Maude Leriche, CRNAPre-anesthesia Checklist: Patient identified, Emergency Drugs available, Suction available, Patient being monitored and Timeout performed Patient Re-evaluated:Patient Re-evaluated prior to induction Oxygen Delivery Method: Nasal cannula Preoxygenation: Pre-oxygenation with 100% oxygen Induction Type: IV induction

## 2022-06-04 NOTE — Anesthesia Preprocedure Evaluation (Addendum)
Anesthesia Evaluation  Patient identified by MRN, date of birth, ID band Patient awake    Reviewed: Allergy & Precautions, H&P , NPO status , Patient's Chart, lab work & pertinent test results  Airway Mallampati: II  TM Distance: >3 FB Neck ROM: Limited   Comment: Cervical spondylosis with radiculopathy Dental  (+) Dental Advisory Given Crowns :   Pulmonary neg pulmonary ROS   Pulmonary exam normal breath sounds clear to auscultation       Cardiovascular negative cardio ROS Normal cardiovascular exam Rhythm:Regular Rate:Normal     Neuro/Psych  PSYCHIATRIC DISORDERS  Depression     Neuromuscular disease (Cervical spondylosis with radiculopathy)    GI/Hepatic ,GERD  Medicated,,(+) Hepatitis -  Endo/Other  negative endocrine ROS    Renal/GU negative Renal ROS  negative genitourinary   Musculoskeletal  (+) Arthritis , Osteoarthritis,    Abdominal   Peds negative pediatric ROS (+)  Hematology negative hematology ROS (+)   Anesthesia Other Findings   Reproductive/Obstetrics negative OB ROS                             Anesthesia Physical Anesthesia Plan  ASA: 2  Anesthesia Plan: General   Post-op Pain Management: Minimal or no pain anticipated   Induction: Intravenous  PONV Risk Score and Plan: 1 and Propofol infusion  Airway Management Planned: Nasal Cannula  Additional Equipment:   Intra-op Plan:   Post-operative Plan:   Informed Consent: I have reviewed the patients History and Physical, chart, labs and discussed the procedure including the risks, benefits and alternatives for the proposed anesthesia with the patient or authorized representative who has indicated his/her understanding and acceptance.     Dental advisory given  Plan Discussed with: CRNA and Surgeon  Anesthesia Plan Comments:        Anesthesia Quick Evaluation

## 2022-06-04 NOTE — Transfer of Care (Signed)
Immediate Anesthesia Transfer of Care Note  Patient: Gina Lawrence  Procedure(s) Performed: COLONOSCOPY WITH PROPOFOL POLYPECTOMY INTESTINAL  Patient Location: PACU  Anesthesia Type:General  Level of Consciousness: awake, alert , and oriented  Airway & Oxygen Therapy: Patient Spontanous Breathing  Post-op Assessment: Report given to RN, Post -op Vital signs reviewed and stable, Patient moving all extremities X 4, and Patient able to stick tongue midline  Post vital signs: Reviewed  Last Vitals:  Vitals Value Taken Time  BP 119/75 06/04/22 0758  Temp 36.4 C 06/04/22 0758  Pulse 73 06/04/22 0758  Resp 14 06/04/22 0758  SpO2 97 % 06/04/22 0758    Last Pain:  Vitals:   06/04/22 0758  TempSrc: Oral  PainSc: 0-No pain         Complications: No notable events documented.

## 2022-06-04 NOTE — Discharge Instructions (Addendum)
You are being discharged to home.  Resume your previous diet.  We are waiting for your pathology results.  Your physician has recommended a repeat colonoscopy for surveillance based on pathology results.  

## 2022-06-04 NOTE — Anesthesia Postprocedure Evaluation (Signed)
Anesthesia Post Note  Patient: Gina Lawrence  Procedure(s) Performed: COLONOSCOPY WITH PROPOFOL POLYPECTOMY INTESTINAL  Patient location during evaluation: Phase II Anesthesia Type: General Level of consciousness: awake and alert and oriented Pain management: pain level controlled Vital Signs Assessment: post-procedure vital signs reviewed and stable Respiratory status: spontaneous breathing, nonlabored ventilation and respiratory function stable Cardiovascular status: blood pressure returned to baseline and stable Postop Assessment: no apparent nausea or vomiting Anesthetic complications: no  No notable events documented.   Last Vitals:  Vitals:   06/04/22 0655 06/04/22 0758  BP: 133/82 119/75  Pulse: 82 73  Resp: 10 14  Temp: 36.9 C (!) 36.4 C  SpO2: 97% 97%    Last Pain:  Vitals:   06/04/22 0758  TempSrc: Oral  PainSc: 0-No pain                 Ahnyla Mendel C Karina Lenderman

## 2022-06-04 NOTE — Op Note (Signed)
[facility] Patient Name: Gina Lawrence Procedure Date: 06/04/2022 7:06 AM MRN: [id number] Date of Birth: 1965-08-31 Attending MD: Maylon Peppers , , [id number] CSN: [id number] Age: 57 Admit Type: Outpatient Procedure:                Colonoscopy Indications:              High risk colon cancer surveillance: Personal                            history of colonic polyps Providers:                Maylon Peppers, Crystal Page, Aram Candela Referring MD:              Medicines:                Monitored Anesthesia Care Complications:            No immediate complications. Estimated Blood Loss:     Estimated blood loss: none. Procedure:                Pre-Anesthesia Assessment:                           - Prior to the procedure, a History and Physical                            was performed, and patient medications, allergies                            and sensitivities were reviewed. The patient's                            tolerance of previous anesthesia was reviewed.                           - The risks and benefits of the procedure and the                            sedation options and risks were discussed with the                            patient. All questions were answered and informed                            consent was obtained.                           - ASA Grade Assessment: II - A patient with mild                            systemic disease.                           After obtaining informed consent, the colonoscope                            was passed under direct vision. Throughout the  procedure, the patient's blood pressure, pulse, and                            oxygen saturations were monitored continuously. The                            [id number]-HQ190L ([id number]) scope was introduced through                            the anus and advanced to the the cecum, identified                            by appendiceal orifice and  ileocecal valve. The                            colonoscopy was performed without difficulty. The                            patient tolerated the procedure well. The quality                            of the bowel preparation was adequate. Scope In: 7:36:29 AM Scope Out: 7:55:03 AM Scope Withdrawal Time: 0 hours 14 minutes 28 seconds  Total Procedure Duration: 0 hours 18 minutes 34 seconds  Findings:      The perianal and digital rectal examinations were normal.      A 6 mm polyp was found in the cecum. The polyp was sessile. The polyp       was removed with a cold snare. Resection and retrieval were complete.      Non-bleeding internal hemorrhoids were found during retroflexion. The       hemorrhoids were small. Impression:               - One 6 mm polyp in the cecum, removed with a cold                            snare. Resected and retrieved.                           - Non-bleeding internal hemorrhoids. Moderate Sedation:      Per Anesthesia Care Recommendation:           - Discharge patient to home (ambulatory).                           - Resume previous diet.                           - Await pathology results.                           - Repeat colonoscopy for surveillance based on                            pathology results. Procedure Code(s):        --- Professional ---  45385, Colonoscopy, flexible; with removal of                            tumor(s), polyp(s), or other lesion(s) by snare                            technique Diagnosis Code(s):        --- Professional ---                           Z86.010, Personal history of colonic polyps                           D12.0, Benign neoplasm of cecum                           K64.8, Other hemorrhoids CPT copyright 2022 [employer]. All rights reserved. The codes documented in this report are preliminary and upon coder review may  be revised to meet current compliance  requirements. Maylon Peppers, MD Maylon Peppers,  06/04/2022 7:59:42 AM This report has been signed electronically. Number of Addenda: 0

## 2022-06-05 ENCOUNTER — Encounter (INDEPENDENT_AMBULATORY_CARE_PROVIDER_SITE_OTHER): Payer: Self-pay | Admitting: *Deleted

## 2022-06-05 LAB — SURGICAL PATHOLOGY

## 2022-06-06 ENCOUNTER — Telehealth: Payer: Self-pay | Admitting: Family Medicine

## 2022-06-06 NOTE — Telephone Encounter (Signed)
Noted, will call once we receive labs.

## 2022-06-06 NOTE — Telephone Encounter (Signed)
Patient went to [facility] to have labs drawn, she will be sending them to the [facility] email and would like to speak to someone about the levels once we receive. Please call back.

## 2022-06-09 ENCOUNTER — Encounter (HOSPITAL_COMMUNITY): Payer: Self-pay | Admitting: Gastroenterology

## 2022-07-01 ENCOUNTER — Encounter: Payer: Self-pay | Admitting: Family Medicine

## 2024-02-17 ENCOUNTER — Encounter (INDEPENDENT_AMBULATORY_CARE_PROVIDER_SITE_OTHER): Payer: Self-pay | Admitting: Gastroenterology

## 2024-04-07 ENCOUNTER — Other Ambulatory Visit: Payer: Self-pay | Admitting: Medical Genetics

## 2024-04-14 ENCOUNTER — Ambulatory Visit: Payer: Self-pay | Admitting: Family Medicine

## 2024-04-14 ENCOUNTER — Encounter: Payer: Self-pay | Admitting: Family Medicine

## 2024-04-14 VITALS — BP 134/86 | HR 62 | Temp 98.4°F | Ht 66.0 in | Wt 193.4 lb

## 2024-04-14 DIAGNOSIS — Z6831 Body mass index (BMI) 31.0-31.9, adult: Secondary | ICD-10-CM

## 2024-04-14 DIAGNOSIS — Z130 Encounter for screening for diseases of the blood and blood-forming organs and certain disorders involving the immune mechanism: Secondary | ICD-10-CM

## 2024-04-14 DIAGNOSIS — R7303 Prediabetes: Secondary | ICD-10-CM

## 2024-04-14 DIAGNOSIS — E782 Mixed hyperlipidemia: Secondary | ICD-10-CM

## 2024-04-14 DIAGNOSIS — Z0000 Encounter for general adult medical examination without abnormal findings: Secondary | ICD-10-CM

## 2024-04-14 LAB — BAYER DCA HB A1C WAIVED: HB A1C (BAYER DCA - WAIVED): 5.6 % (ref 4.8–5.6)

## 2024-04-14 NOTE — Progress Notes (Signed)
 Complete physical exam  Patient: Gina Lawrence   DOB: 11-27-1965   58 y.o. Female  MRN: [id number]  Subjective:    Chief Complaint  Patient presents with   Annual Exam    Gina Lawrence is a 58 y.o. female who presents today for a complete physical exam. She reports consuming a fairly well balanced diet. She is working to eat less processed foods. The patient has a physically strenuous job, but has no regular exercise apart from work.  She generally feels well. She reports sleeping well. She does not have additional problems to discuss today.    Most recent fall risk assessment:    04/14/2024    1:04 PM  Fall Risk   Falls in the past year? 0     Most recent depression screenings:    04/14/2024    1:04 PM 03/26/2022   10:55 AM  PHQ 2/9 Scores  PHQ - 2 Score 0 0  PHQ- 9 Score 0 0      Data saved with a previous flowsheet row definition    Vision:Not within last year  and Dental: No current dental problems and No regular dental care     Patient Care Team: Joesph Annabella HERO, FNP as PCP - General (Family Medicine)   Show/hide medication list[1]  ROS Negative unless specially indicated above in HPI.     Objective:     BP 134/86   Pulse 62   Temp 98.4 F (36.9 C) (Temporal)   Ht 5' 6 (1.676 m)   Wt 193 lb 6.4 oz (87.7 kg)   SpO2 98%   BMI 31.22 kg/m    Physical Exam Vitals and nursing note reviewed.  Constitutional:      General: She is not in acute distress.    Appearance: She is obese. She is not ill-appearing, toxic-appearing or diaphoretic.  HENT:     Head: Normocephalic.     Right Ear: Tympanic membrane, ear canal and external ear normal.     Left Ear: Tympanic membrane, ear canal and external ear normal.     Nose: Nose normal.     Mouth/Throat:     Mouth: Mucous membranes are moist.     Pharynx: Oropharynx is clear.  Eyes:     Extraocular Movements: Extraocular movements intact.     Conjunctiva/sclera: Conjunctivae normal.      Pupils: Pupils are equal, round, and reactive to light.  Cardiovascular:     Rate and Rhythm: Normal rate and regular rhythm.     Pulses: Normal pulses.     Heart sounds: Normal heart sounds. No murmur heard.    No friction rub. No gallop.  Pulmonary:     Effort: Pulmonary effort is normal.     Breath sounds: Normal breath sounds.  Abdominal:     General: Bowel sounds are normal. There is no distension.     Palpations: Abdomen is soft. There is no mass.     Tenderness: There is no abdominal tenderness. There is no guarding.  Musculoskeletal:        General: No tenderness. Normal range of motion.     Cervical back: Normal range of motion and neck supple. No tenderness.     Right lower leg: No edema.     Left lower leg: No edema.  Skin:    General: Skin is warm and dry.     Capillary Refill: Capillary refill takes less than 2 seconds.     Findings: No lesion or  rash.  Neurological:     General: No focal deficit present.     Mental Status: She is alert and oriented to person, place, and time.     Cranial Nerves: No cranial nerve deficit.     Motor: No weakness.     Gait: Gait normal.  Psychiatric:        Mood and Affect: Mood normal.        Behavior: Behavior normal.        Thought Content: Thought content normal.        Judgment: Judgment normal.      No results found for any visits on 04/14/24.     Assessment & Plan:    Routine Health Maintenance and Physical Exam  Gina Lawrence was seen today for annual exam.  Diagnoses and all orders for this visit:  Routine general medical examination at a health care facility  BMI 31.0-31.9,adult Diet, exercise, weight loss.   Mixed hyperlipidemia Labs pending.  -     Lipid panel  Prediabetes -     Bayer DCA Hb A1c Waived  Screening for endocrine, metabolic and immunity disorder -     CBC with Differential/Platelet -     CMP14+EGFR -     TSH    There is no immunization history on file for this patient.  Health  Maintenance  Topic Date Due   Zoster Vaccines- Shingrix (1 of 2) Never done   Influenza Vaccine  08/02/2024 (Originally 12/04/2023)   Cervical Cancer Screening (HPV/Pap Cotest)  04/14/2025 (Originally 03/25/2024)   DTaP/Tdap/Td (1 - Tdap) 04/14/2025 (Originally 03/02/1983)   Pneumococcal Vaccine: 50+ Years (1 of 1 - PCV) 04/14/2025 (Originally 03/01/2014)   Mammogram  04/14/2025 (Originally 09/27/2022)   HIV Screening  04/14/2025 (Originally 03/02/1979)   COVID-19 Vaccine (1 - 2025-26 season) 04/30/2025 (Originally 01/04/2024)   Colonoscopy  06/04/2029   Hepatitis C Screening  Completed   Hepatitis B Vaccines 19-59 Average Risk  Aged Out   HPV VACCINES  Aged Out   Meningococcal B Vaccine  Aged Out    Discussed health benefits of physical activity, and encouraged her to engage in regular exercise appropriate for her age and condition.  Problem List Items Addressed This Visit       Other   BMI 31.0-31.9,adult   Prediabetes   Relevant Orders   Bayer DCA Hb A1c Waived   Mixed hyperlipidemia   Relevant Orders   Lipid panel   Other Visit Diagnoses       Routine general medical examination at a health care facility    -  Primary     Screening for endocrine, metabolic and immunity disorder       Relevant Orders   CBC with Differential/Platelet   CMP14+EGFR   TSH      Return in about 1 year (around 04/14/2025) for CPE.   The patient indicates understanding of these issues and agrees with the plan.  Annabella CHRISTELLA Search, FNP      [1]  Outpatient Medications Prior to Visit  Medication Sig   calcium  carbonate (OS-CAL) 1250 (500 Ca) MG chewable tablet Chew 1 tablet by mouth daily.   Cholecalciferol  (VITAMIN D -3) 125 MCG (5000 UT) TABS Take 15,000 Units by mouth daily.   esomeprazole (NEXIUM) 20 MG capsule Take 20 mg by mouth daily at 12 noon.   ibuprofen (ADVIL) 200 MG tablet Take 200 mg by mouth every 6 (six) hours as needed for moderate pain.   OVER THE COUNTER MEDICATION Place  1 Dose under the tongue daily. CBD oil (1 dose = 1 dropper full)   [DISCONTINUED] aspirin-acetaminophen -caffeine (EXCEDRIN EXTRA STRENGTH) 250-250-65 MG tablet Take 2 tablets by mouth every 6 (six) hours as needed for headache.   [DISCONTINUED] VITAMIN E PO Take 1 capsule by mouth 2 (two) times a week.   No facility-administered medications prior to visit.

## 2024-04-14 NOTE — Patient Instructions (Signed)

## 2024-04-15 ENCOUNTER — Ambulatory Visit: Payer: Self-pay | Admitting: Family Medicine

## 2024-04-15 LAB — CBC WITH DIFFERENTIAL/PLATELET
Basophils Absolute: 0 x10E3/uL (ref 0.0–0.2)
Basos: 1 %
EOS (ABSOLUTE): 0.2 x10E3/uL (ref 0.0–0.4)
Eos: 3 %
Hematocrit: 45.6 % (ref 34.0–46.6)
Hemoglobin: 14.7 g/dL (ref 11.1–15.9)
Immature Grans (Abs): 0 x10E3/uL (ref 0.0–0.1)
Immature Granulocytes: 0 %
Lymphocytes Absolute: 2.2 x10E3/uL (ref 0.7–3.1)
Lymphs: 40 %
MCH: 28.5 pg (ref 26.6–33.0)
MCHC: 32.2 g/dL (ref 31.5–35.7)
MCV: 89 fL (ref 79–97)
Monocytes Absolute: 0.5 x10E3/uL (ref 0.1–0.9)
Monocytes: 9 %
Neutrophils Absolute: 2.6 x10E3/uL (ref 1.4–7.0)
Neutrophils: 47 %
Platelets: 248 x10E3/uL (ref 150–450)
RBC: 5.15 x10E6/uL (ref 3.77–5.28)
RDW: 13.3 % (ref 11.7–15.4)
WBC: 5.6 x10E3/uL (ref 3.4–10.8)

## 2024-04-15 LAB — CMP14+EGFR
ALT: 23 IU/L (ref 0–32)
AST: 21 IU/L (ref 0–40)
Albumin: 4.4 g/dL (ref 3.8–4.9)
Alkaline Phosphatase: 100 IU/L (ref 49–135)
BUN/Creatinine Ratio: 20 (ref 12–28)
BUN: 17 mg/dL (ref 8–27)
Bilirubin Total: 0.2 mg/dL (ref 0.0–1.2)
CO2: 25 mmol/L (ref 20–29)
Calcium: 9.7 mg/dL (ref 8.7–10.3)
Chloride: 105 mmol/L (ref 96–106)
Creatinine, Ser: 0.83 mg/dL (ref 0.57–1.00)
Globulin, Total: 2.2 g/dL (ref 1.5–4.5)
Glucose: 87 mg/dL (ref 70–99)
Potassium: 4.4 mmol/L (ref 3.5–5.2)
Sodium: 143 mmol/L (ref 134–144)
Total Protein: 6.6 g/dL (ref 6.0–8.5)
eGFR: 81 mL/min/1.73 (ref >59)

## 2024-04-15 LAB — LIPID PANEL
Chol/HDL Ratio: 4.4 ratio (ref 0.0–4.4)
Cholesterol, Total: 207 mg/dL — ABNORMAL HIGH (ref 100–199)
HDL: 47 mg/dL (ref >39)
LDL Chol Calc (NIH): 138 mg/dL — ABNORMAL HIGH (ref 0–99)
Triglycerides: 122 mg/dL (ref 0–149)
VLDL Cholesterol Cal: 22 mg/dL (ref 5–40)

## 2024-04-15 LAB — TSH: TSH: 1.56 u[IU]/mL (ref 0.450–4.500)

## 2024-04-19 ENCOUNTER — Other Ambulatory Visit: Payer: Self-pay | Admitting: Medical Genetics

## 2024-04-19 DIAGNOSIS — Z006 Encounter for examination for normal comparison and control in clinical research program: Secondary | ICD-10-CM

## 2024-05-13 LAB — GENECONNECT MOLECULAR SCREEN: Genetic Analysis Overall Interpretation: NEGATIVE
